# Patient Record
Sex: Female | Born: 1950 | Race: Black or African American | Hispanic: No | Smoking: Former smoker
Health system: Southern US, Community
[De-identification: ages and names within clinical notes are randomized; demographics above are authoritative.]

## PROBLEM LIST (undated history)

## (undated) DIAGNOSIS — R42 Dizziness and giddiness: Secondary | ICD-10-CM

## (undated) DIAGNOSIS — K769 Liver disease, unspecified: Secondary | ICD-10-CM

## (undated) DIAGNOSIS — K8020 Calculus of gallbladder without cholecystitis without obstruction: Secondary | ICD-10-CM

## (undated) DIAGNOSIS — E039 Hypothyroidism, unspecified: Secondary | ICD-10-CM

## (undated) DIAGNOSIS — J449 Chronic obstructive pulmonary disease, unspecified: Secondary | ICD-10-CM

## (undated) DIAGNOSIS — E7800 Pure hypercholesterolemia, unspecified: Secondary | ICD-10-CM

## (undated) DIAGNOSIS — I10 Essential (primary) hypertension: Secondary | ICD-10-CM

## (undated) DIAGNOSIS — E119 Type 2 diabetes mellitus without complications: Secondary | ICD-10-CM

## (undated) HISTORY — DX: Calculus of gallbladder without cholecystitis without obstruction: K80.20

## (undated) HISTORY — PX: CHOLECYSTECTOMY: SHX55

## (undated) HISTORY — DX: Liver disease, unspecified: K76.9

## (undated) HISTORY — PX: TOTAL VAGINAL HYSTERECTOMY: SHX2548

---

## 2019-03-11 ENCOUNTER — Other Ambulatory Visit (HOSPITAL_COMMUNITY): Payer: Self-pay | Admitting: Internal Medicine

## 2019-03-11 DIAGNOSIS — R0602 Shortness of breath: Secondary | ICD-10-CM

## 2019-03-17 ENCOUNTER — Ambulatory Visit (HOSPITAL_COMMUNITY): Payer: Medicare Other | Attending: Cardiology

## 2019-03-17 ENCOUNTER — Other Ambulatory Visit: Payer: Self-pay

## 2019-03-17 DIAGNOSIS — R0602 Shortness of breath: Secondary | ICD-10-CM | POA: Diagnosis not present

## 2019-08-22 ENCOUNTER — Other Ambulatory Visit: Payer: Self-pay

## 2019-08-22 ENCOUNTER — Ambulatory Visit (INDEPENDENT_AMBULATORY_CARE_PROVIDER_SITE_OTHER): Payer: Medicare Other | Admitting: Podiatry

## 2019-08-22 ENCOUNTER — Ambulatory Visit (INDEPENDENT_AMBULATORY_CARE_PROVIDER_SITE_OTHER): Payer: Medicare Other

## 2019-08-22 DIAGNOSIS — B351 Tinea unguium: Secondary | ICD-10-CM

## 2019-08-22 DIAGNOSIS — M79671 Pain in right foot: Secondary | ICD-10-CM

## 2019-08-22 DIAGNOSIS — M79674 Pain in right toe(s): Secondary | ICD-10-CM | POA: Diagnosis not present

## 2019-08-22 DIAGNOSIS — M79675 Pain in left toe(s): Secondary | ICD-10-CM

## 2019-08-22 DIAGNOSIS — M792 Neuralgia and neuritis, unspecified: Secondary | ICD-10-CM

## 2019-08-22 DIAGNOSIS — M79672 Pain in left foot: Secondary | ICD-10-CM

## 2019-08-22 MED ORDER — AMMONIUM LACTATE 12 % EX LOTN: 1.0000 "application " | TOPICAL_LOTION | CUTANEOUS | 0 refills | Status: DC | PRN

## 2019-08-22 MED ORDER — CICLOPIROX 8 % EX SOLN: Freq: Every day | CUTANEOUS | 0 refills | Status: DC

## 2019-08-22 MED ORDER — AMMONIUM LACTATE 12 % EX LOTN: 1.0000 "application " | TOPICAL_LOTION | CUTANEOUS | 0 refills | Status: AC | PRN

## 2019-08-26 ENCOUNTER — Encounter: Payer: Self-pay | Admitting: Podiatry

## 2019-08-26 NOTE — Progress Notes (Signed)
  Subjective:  Patient ID: Joy Becker, female    DOB: [birth year],  MRN: [id number]  Chief Complaint  Patient presents with  . Foot Pain    pt is here for bil foot pain, located on the dorsal foot, pain is elevated at night time. pain is a 8 out of 47   69 y.o. female returns for the above complaint.  Patient presents with pain across the dorsal and plantar aspect submetatarsal 1 through 5 bilaterally.  Patient states been going for a year.  She states is numbness tingling and burning sensation.  The pain is elevated at nighttime.  Patient is a type II diabetic on medication.  Her last A1c wasUnknown.  Patient will also has a secondary complaint of thickened elongated mycotic toenails x10.  She would like to know if they can be debrided down if she is not able to do it herself.  Objective:  There were no vitals filed for this visit. Podiatric Exam: Vascular: dorsalis pedis and posterior tibial pulses are palpable bilateral. Capillary return is immediate. Temperature gradient is WNL. Skin turgor WNL  Sensorium: Normal Semmes Weinstein monofilament test. Normal tactile sensation bilaterally. Nail Exam: Pt has thick disfigured discolored nails with subungual debris noted bilateral entire nail hallux through fifth toenails Ulcer Exam: There is no evidence of ulcer or pre-ulcerative changes or infection. Orthopedic Exam: Muscle tone and strength are WNL. No limitations in general ROM. No crepitus or effusions noted. HAV  B/L.  Hammer toes 2-5  B/L. Skin: No Porokeratosis. No infection or ulcers  Assessment & Plan:  Patient was evaluated and treated and all questions answered.  Onychomycosis with pain  -Nails palliatively debrided as below. -Educated on self-care -Penlac was dispensed as patient would like to try a topical medication to help address the fungus on her toes bilaterally.  I explained to her that this works about 30 to 40% of the time at best.  She states understanding would like to  still proceed with the topical medication. -AmLactin lotion was dispensed for dry skin bilaterally.  Procedure: Nail Debridement Rationale: pain  Type of Debridement: manual, sharp debridement. Instrumentation: Nail nipper, rotary burr. Number of Nails: 10  Procedures and Treatment: Consent by patient was obtained for treatment procedures. The patient understood the discussion of treatment and procedures well. All questions were answered thoroughly reviewed. Debridement of mycotic and hypertrophic toenails, 1 through 5 bilateral and clearing of subungual debris. No ulceration, no infection noted.  Return Visit-Office Procedure: Patient instructed to return to the office for a follow up visit 3 months for continued evaluation and treatment.  Boneta Lucks, DPM    No follow-ups on file.

## 2019-09-24 ENCOUNTER — Other Ambulatory Visit: Payer: Self-pay | Admitting: Internal Medicine

## 2019-09-24 DIAGNOSIS — Z1231 Encounter for screening mammogram for malignant neoplasm of breast: Secondary | ICD-10-CM

## 2019-09-29 ENCOUNTER — Encounter: Payer: Self-pay | Admitting: Internal Medicine

## 2019-11-07 ENCOUNTER — Other Ambulatory Visit: Payer: Self-pay | Admitting: Podiatry

## 2019-11-07 DIAGNOSIS — M79674 Pain in right toe(s): Secondary | ICD-10-CM

## 2019-11-07 DIAGNOSIS — B351 Tinea unguium: Secondary | ICD-10-CM

## 2019-11-07 DIAGNOSIS — M79675 Pain in left toe(s): Secondary | ICD-10-CM

## 2019-11-07 DIAGNOSIS — M792 Neuralgia and neuritis, unspecified: Secondary | ICD-10-CM

## 2019-11-21 ENCOUNTER — Other Ambulatory Visit: Payer: Self-pay

## 2019-11-21 ENCOUNTER — Ambulatory Visit (INDEPENDENT_AMBULATORY_CARE_PROVIDER_SITE_OTHER): Payer: Medicare Other | Admitting: Podiatry

## 2019-11-21 DIAGNOSIS — M79674 Pain in right toe(s): Secondary | ICD-10-CM

## 2019-11-21 DIAGNOSIS — M79675 Pain in left toe(s): Secondary | ICD-10-CM | POA: Diagnosis not present

## 2019-11-21 DIAGNOSIS — M778 Other enthesopathies, not elsewhere classified: Secondary | ICD-10-CM | POA: Diagnosis not present

## 2019-11-21 DIAGNOSIS — M19079 Primary osteoarthritis, unspecified ankle and foot: Secondary | ICD-10-CM | POA: Diagnosis not present

## 2019-11-21 DIAGNOSIS — B351 Tinea unguium: Secondary | ICD-10-CM

## 2019-11-27 NOTE — Progress Notes (Signed)
  Subjective:  Patient ID: Joy Becker, female    DOB: 21-Jul-1950,  MRN: [id number]  Chief Complaint  Patient presents with  . Foot Pain    pt is here for routine foot care, pt is also a diabetic type 2.   69 y.o. female returns for the above complaint.  Patient presents with a complaint of thickened elongated dystrophic toenails x10.  Patient states that they are painful to walk on.  They have gone along and she is unable to debride down herself.  She would like for me to debride all detail toenails.  She also has secondary complaint of left dorsal midfoot pain that started about an hour.  Is dull achy in nature.  Is severely associated with weather changes.  She has not seen anyone else for this pain.  She has not tried any other treatment option.  She denies any other acute complaints.    Objective:  There were no vitals filed for this visit. Podiatric Exam: Vascular: dorsalis pedis and posterior tibial pulses are palpable bilateral. Capillary return is immediate. Temperature gradient is WNL. Skin turgor WNL  Sensorium: Normal Semmes Weinstein monofilament test. Normal tactile sensation bilaterally. Nail Exam: Pt has thick disfigured discolored nails with subungual debris noted bilateral entire nail hallux through fifth toenails Ulcer Exam: There is no evidence of ulcer or pre-ulcerative changes or infection. Orthopedic Exam: Muscle tone and strength are WNL. No limitations in general ROM. No crepitus or effusions noted. HAV  B/L.  Hammer toes 2-5  B/L.  Pain on palpation to the dorsal midfoot across the second through third tarsometatarsal joint.  Crepitus noted.  Mild spurring palpable. Skin: No Porokeratosis. No infection or ulcers  Assessment & Plan:  Patient was evaluated and treated and all questions answered.  Dorsal midfoot arthritis/capsulitis -I explained to the patient the etiology of arthritis and various treatment options were extensively discussed.  I believe patient will  benefit from a steroid injection help decrease the acute inflammatory component associated with pain.  Patient agrees with the plan would like to proceed with injection. -A steroid injection was performed at left dorsal midfoot using 1% plain Lidocaine and 10 mg of Kenalog. This was well tolerated.   Onychomycosis with pain  -Nails palliatively debrided as below. -Educated on self-care -Penlac was dispensed as patient would like to try a topical medication to help address the fungus on her toes bilaterally.  I explained to her that this works about 30 to 40% of the time at best.  She states understanding would like to still proceed with the topical medication. -AmLactin lotion was dispensed for dry skin bilaterally.  Procedure: Nail Debridement Rationale: pain  Type of Debridement: manual, sharp debridement. Instrumentation: Nail nipper, rotary burr. Number of Nails: 10  Procedures and Treatment: Consent by patient was obtained for treatment procedures. The patient understood the discussion of treatment and procedures well. All questions were answered thoroughly reviewed. Debridement of mycotic and hypertrophic toenails, 1 through 5 bilateral and clearing of subungual debris. No ulceration, no infection noted.  Return Visit-Office Procedure: Patient instructed to return to the office for a follow up visit 3 months for continued evaluation and treatment.  Boneta Lucks, DPM    No follow-ups on file.

## 2020-01-20 ENCOUNTER — Ambulatory Visit (HOSPITAL_COMMUNITY)
Admission: RE | Admit: 2020-01-20 | Discharge: 2020-01-20 | Disposition: A | Discharge status: Home / Self Care | Payer: Medicare Other | Source: Ambulatory Visit | Attending: Vascular Surgery | Admitting: Vascular Surgery

## 2020-01-20 ENCOUNTER — Other Ambulatory Visit (HOSPITAL_COMMUNITY): Payer: Self-pay | Admitting: Internal Medicine

## 2020-01-20 ENCOUNTER — Other Ambulatory Visit: Payer: Self-pay

## 2020-01-20 DIAGNOSIS — R609 Edema, unspecified: Secondary | ICD-10-CM

## 2020-02-20 ENCOUNTER — Ambulatory Visit: Payer: Medicare Other | Admitting: Podiatry

## 2020-10-01 ENCOUNTER — Emergency Department (HOSPITAL_COMMUNITY)
Admission: EM | Admit: 2020-10-01 | Discharge: 2020-10-01 | Disposition: A | Discharge status: Home / Self Care | Payer: Medicare Other | Attending: Emergency Medicine | Admitting: Emergency Medicine

## 2020-10-01 ENCOUNTER — Other Ambulatory Visit: Payer: Self-pay

## 2020-10-01 ENCOUNTER — Emergency Department (HOSPITAL_COMMUNITY): Payer: Medicare Other

## 2020-10-01 ENCOUNTER — Encounter (HOSPITAL_COMMUNITY): Payer: Self-pay | Admitting: Emergency Medicine

## 2020-10-01 DIAGNOSIS — I959 Hypotension, unspecified: Secondary | ICD-10-CM | POA: Diagnosis not present

## 2020-10-01 DIAGNOSIS — K118 Other diseases of salivary glands: Secondary | ICD-10-CM | POA: Diagnosis not present

## 2020-10-01 DIAGNOSIS — R519 Headache, unspecified: Secondary | ICD-10-CM | POA: Insufficient documentation

## 2020-10-01 DIAGNOSIS — R630 Anorexia: Secondary | ICD-10-CM | POA: Diagnosis not present

## 2020-10-01 DIAGNOSIS — R42 Dizziness and giddiness: Secondary | ICD-10-CM

## 2020-10-01 LAB — CBC WITH DIFFERENTIAL/PLATELET
Abs Immature Granulocytes: 0.05 10*3/uL (ref 0.00–0.07)
Basophils Absolute: 0 10*3/uL (ref 0.0–0.1)
Basophils Relative: 1 %
Eosinophils Absolute: 0.4 10*3/uL (ref 0.0–0.5)
Eosinophils Relative: 7 %
HCT: 47.9 % — ABNORMAL HIGH (ref 36.0–46.0)
Hemoglobin: 15.9 g/dL — ABNORMAL HIGH (ref 12.0–15.0)
Immature Granulocytes: 1 %
Lymphocytes Relative: 12 %
Lymphs Abs: 0.7 10*3/uL (ref 0.7–4.0)
MCH: 26.5 pg (ref 26.0–34.0)
MCHC: 33.2 g/dL (ref 30.0–36.0)
MCV: 79.8 fL — ABNORMAL LOW (ref 80.0–100.0)
Monocytes Absolute: 0.8 10*3/uL (ref 0.1–1.0)
Monocytes Relative: 13 %
Neutro Abs: 4 10*3/uL (ref 1.7–7.7)
Neutrophils Relative %: 66 %
Platelets: 220 10*3/uL (ref 150–400)
RBC: 6 MIL/uL — ABNORMAL HIGH (ref 3.87–5.11)
RDW: 16 % — ABNORMAL HIGH (ref 11.5–15.5)
WBC: 6 10*3/uL (ref 4.0–10.5)
nRBC: 0 % (ref 0.0–0.2)

## 2020-10-01 LAB — COMPREHENSIVE METABOLIC PANEL
ALT: 24 U/L (ref 0–44)
AST: 22 U/L (ref 15–41)
Albumin: 3.1 g/dL — ABNORMAL LOW (ref 3.5–5.0)
Alkaline Phosphatase: 59 U/L (ref 38–126)
Anion gap: 7 (ref 5–15)
BUN: 9 mg/dL (ref 8–23)
CO2: 25 mmol/L (ref 22–32)
Calcium: 10.1 mg/dL (ref 8.9–10.3)
Chloride: 104 mmol/L (ref 98–111)
Creatinine, Ser: 1.1 mg/dL — ABNORMAL HIGH (ref 0.44–1.00)
GFR, Estimated: 55 mL/min — ABNORMAL LOW (ref >60)
Glucose, Bld: 119 mg/dL — ABNORMAL HIGH (ref 70–99)
Potassium: 4.1 mmol/L (ref 3.5–5.1)
Sodium: 136 mmol/L (ref 135–145)
Total Bilirubin: 0.5 mg/dL (ref 0.3–1.2)
Total Protein: 6.6 g/dL (ref 6.5–8.1)

## 2020-10-01 LAB — TROPONIN I (HIGH SENSITIVITY): Troponin I (High Sensitivity): 4 ng/L (ref <18)

## 2020-10-01 MED ORDER — MIDAZOLAM HCL 2 MG/2ML IJ SOLN
2.0000 mg | Freq: Once | INTRAMUSCULAR | Status: AC | PRN
  Administered 2020-10-01: 2 mg via INTRAVENOUS
  Filled 2020-10-01: qty 2

## 2020-10-01 MED ORDER — SODIUM CHLORIDE 0.9 % IV BOLUS
1000.0000 mL | Freq: Once | INTRAVENOUS | Status: AC
  Administered 2020-10-01: 1000 mL via INTRAVENOUS

## 2020-10-01 MED ORDER — MECLIZINE HCL 25 MG PO TABS
25.0000 mg | ORAL_TABLET | Freq: Once | ORAL | Status: AC
  Administered 2020-10-01: 25 mg via ORAL
  Filled 2020-10-01: qty 1

## 2020-10-01 NOTE — ED Notes (Signed)
Patient up and ambulatory to restroom from room 26. Steady and stable. Denies dizziness at this time.

## 2020-10-01 NOTE — ED Triage Notes (Signed)
Emergency Medicine Provider Triage Evaluation Note  Joy Becker , a 70 y.o. female  was evaluated in triage.  Pt complains of lightheadedness, dizziness and balance issues.  First noticed March 29th 2022.  Last night fell while going to the bathroom last.  Denies any head trauma or LOC in fall.  Currently taking meclizine with little improvement in symptoms.  Dizziness worse when standing.   Currently on bactrim for UTI.    Review of Systems  Positive: Dizziness,  Negative: Head pain, neck pain, back pain  Physical Exam  BP (!) 95/48 (BP Location: Right Arm)   Pulse 88   Temp 99.3 F (37.4 C) (Oral)   Resp 14   SpO2 94%  Gen:   Awake, no distress   HEENT:  Atraumatic  Resp:  Normal effort  Cardiac:  Normal rate  MSK:   Moves extremities without difficulty, no midline tenderness or deformity to cervical, thoracic or lumbar spine  Neuro:  Speech clear, CN 2-12 intact, equal grip strength   Medical Decision Making  Medically screening exam initiated at 4:21 PM.  Appropriate orders placed.  Joy Becker was informed that the remainder of the evaluation will be completed by another provider, this initial triage assessment does not replace that evaluation, and the importance of remaining in the ED until their evaluation is complete.  Clinical Impression   Dizziness; The patient appears stable so that the remainder of the work up may be completed by another provider.     Loni Beckwith, [place] 10/01/20 1630

## 2020-10-01 NOTE — ED Triage Notes (Signed)
Pt coming from home. Complaint of lightheadedness since last month, states shes been seen by her dr but not getting any better.

## 2020-10-01 NOTE — ED Notes (Signed)
Patient returns from MRI- placed back on monitor at this time. Resting comfortably, awaiting imaging results.

## 2020-10-01 NOTE — ED Notes (Signed)
Patient to MRI, placed on 2L due to versed.

## 2020-10-01 NOTE — Discharge Instructions (Addendum)
Your MRI scan showed signs of your old stroke in the back of the brain.  An old stroke can have symptoms flare up again.  This may be causing your blurred vision and headache.  You need to follow up with a neurologist for this.  I placed a referral to neurology.    You have a nodule or spot in your parotid gland (near your cheek).  You will need to make an appointment with an ENT doctor about this issue.  Call the number above for [facility].  This can be done as an outpatient.  Your blood pressure was too low today.  I advised that you cut your Losartan medication dose in half.  You will take 50 mg Losartan every day, and 50 mg Metoprolol.  Check your blood pressure every day and keep a diary.  Your primary care doctor can make further adjustments.  Low blood pressure can cause dizziness, lightheadedness.  If you ever feel dizzy and your blood pressure is less than 90 mmhg (top number), don't take your evening blood pressure medicine, and lay down flat.  If you still feel dizzy after 10 minutes, call 911 and come back to the ER.  You should come back to the ER if you begin feeling very lightheaded, began having chest pain, difficulty breathing, or feel like passing out.  You should also return if you have difficulty speaking, numbness or weakness of your arms or legs, severe headache, or any other symptoms concerning for stroke.

## 2020-10-01 NOTE — ED Notes (Signed)
Patient reports dizziness since 3/29. Was seen at PCP and given medication for that with no relief. Patient then reports that she was dx with UTI and put on Abx as well. Patient reports last night feeling "like I overdosed or something, I ran into a wall and was really dizzy." Patient reporting headache for last 2 days. Reports neck stiffness and 4/10 pain. Patient appears in no acute distress at this time.

## 2020-10-01 NOTE — ED Provider Notes (Signed)
[place] EMERGENCY DEPARTMENT Provider Note   CSN: [id number] Arrival date & time: 10/01/20  1514     History Chief Complaint  Patient presents with  . Dizziness    Joy Becker is a 70 y.o. female with history of type 2 diabetes, hypertension, presenting to the emergency department with headache, vertigo, blurred vision.  Patient reports onset of symptoms approximately 9 to 10 days ago.  She says she woke up feeling generally lightheaded, but specifically describes vertigo.  She says it is worse when standing up from sitting.  She feels like she is walking drunk, needing to grab onto things when she is walking.  She also describes a headache beginning in the past 1 to 2 days, which is frontal and posterior located.  She does suffer from chronic headaches but feels this is a new pattern.  She also describes to me some blurred vision in both of her eyes for the past week.  She wears prescription lenses.  She reports nausea and poor appetite for the past several days.  She denies vomiting, diarrhea, constipation.  She denies fevers, chills, cough, congestion.  She states she thinks she may have had a stroke about 4 to 5 years ago, worked up in [employer]. Her symptoms were blurred vision at the time.  She denies to me any chest pain or new shortness of breath.  She arrives with a medication list, which includes metoprolol 50 mg daily, as well as losartan 100 mg/day.  She says normally her blood pressure is high.  She states that she does think she drinks a lot of water at home.  Her daughter at the bedside disputes this and thinks maybe she does not drink enough water.  She is also being treated currently with Bactrim for UTI, though she is asymptomatic in terms of dysuria.  She's had 3 doses of the covid vaccine.  She does not smoke.  HPI     History reviewed. No pertinent past medical history.  There are no problems to display for this  patient.   History reviewed. No pertinent surgical history.   OB History   No obstetric history on file.     History reviewed. No pertinent family history.     Home Medications Prior to Admission medications   Medication Sig Start Date End Date Taking? Authorizing Provider  albuterol (VENTOLIN HFA) 108 (90 Base) MCG/ACT inhaler INHALE 1 2 PUFFS INTO THE LUNGS EVERY 6 HOURS FOR WHEEZING 10/21/19   [provider]  ammonium lactate (AMLACTIN) 12 % lotion Apply 1 application topically as needed for dry skin. 08/22/19   Felipa Furnace, DPM  ciclopirox (PENLAC) 8 % solution Apply topically at bedtime. Apply over nail and surrounding skin. Apply daily over previous coat. After seven (7) days, may remove with alcohol and continue cycle. 08/22/19   Felipa Furnace, DPM  famotidine (PEPCID) 20 MG tablet Take 20 mg by mouth 2 (two) times daily. 10/16/19   [provider]  INCRUSE ELLIPTA 62.5 MCG/INH AEPB 1 puff daily. 08/06/19   [provider]  JANUVIA 100 MG tablet Take 100 mg by mouth daily. 08/09/19   [provider]    Allergies    Ace inhibitors and Azithromycin  Review of Systems   Review of Systems  Constitutional: Positive for appetite change. Negative for chills and fever.  Eyes: Positive for visual disturbance. Negative for photophobia, pain, discharge and redness.  Respiratory: Negative for cough and shortness of  breath.   Cardiovascular: Negative for chest pain and palpitations.  Gastrointestinal: Negative for abdominal pain, diarrhea, nausea and vomiting.  Genitourinary: Negative for dysuria and hematuria.  Musculoskeletal: Negative for arthralgias and myalgias.  Skin: Negative for color change and rash.  Neurological: Positive for dizziness, light-headedness and headaches. Negative for seizures, syncope, facial asymmetry, speech difficulty, weakness and numbness.  Psychiatric/Behavioral: Negative for agitation and confusion.  All other  systems reviewed and are negative.   Physical Exam Updated Vital Signs BP (!) 148/71   Pulse 74   Temp 99.3 F (37.4 C) (Oral)   Resp (!) 26   SpO2 94%   Physical Exam Constitutional:      General: She is not in acute distress. HENT:     Head: Normocephalic and atraumatic.  Eyes:     Conjunctiva/sclera: Conjunctivae normal.     Pupils: Pupils are equal, round, and reactive to light.  Cardiovascular:     Rate and Rhythm: Normal rate and regular rhythm.     Pulses: Normal pulses.  Pulmonary:     Effort: Pulmonary effort is normal. No respiratory distress.  Abdominal:     General: There is no distension.     Tenderness: There is no abdominal tenderness.  Skin:    General: Skin is warm and dry.  Neurological:     General: No focal deficit present.     Mental Status: She is alert. Mental status is at baseline.     GCS: GCS eye subscore is 4. GCS verbal subscore is 5. GCS motor subscore is 6.     Motor: Motor function is intact.     Coordination: Coordination is intact. Finger-Nose-Finger Test normal.     Comments: Unilateral right-beating nystagmus with leftward gaze    Psychiatric:        Mood and Affect: Mood normal.        Behavior: Behavior normal.     ED Results / Procedures / Treatments   Labs (all labs ordered are listed, but only abnormal results are displayed) Labs Reviewed  CBC WITH DIFFERENTIAL/PLATELET - Abnormal; Notable for the following components:      Result Value   RBC 6.00 (*)    Hemoglobin 15.9 (*)    HCT 47.9 (*)    MCV 79.8 (*)    RDW 16.0 (*)    All other components within normal limits  COMPREHENSIVE METABOLIC PANEL - Abnormal; Notable for the following components:   Glucose, Bld 119 (*)    Creatinine, Ser 1.10 (*)    Albumin 3.1 (*)    GFR, Estimated 55 (*)    All other components within normal limits  TROPONIN I (HIGH SENSITIVITY)    EKG EKG Interpretation  Date/Time:  Friday October 01 2020 21:09:53 EDT Ventricular Rate:   76 PR Interval:  174 QRS Duration: 97 QT Interval:  380 QTC Calculation: 428 R Axis:   53 Text Interpretation: Sinus rhythm Low voltage, precordial leads Confirmed by Octaviano Glow [phone]) on 10/01/2020 9:15:03 PM   Radiology MR BRAIN WO CONTRAST  Result Date: 10/01/2020 CLINICAL DATA:  Initial evaluation for neuro deficit, stroke suspected, vertigo, blurry vision. EXAM: MRI HEAD WITHOUT CONTRAST TECHNIQUE: Multiplanar, multiecho pulse sequences of the brain and surrounding structures were obtained without intravenous contrast. COMPARISON:  None available. FINDINGS: Brain: Cerebral volume within normal limits for age. Minimal hazy T2/FLAIR hyperintensity seen within the periventricular white matter, most likely related chronic microvascular ischemic disease, felt to be within normal limits for age. Focal area of  encephalomalacia and gliosis seen involving the parasagittal left occipital lobe, consistent with a chronic left PCA distribution infarct. No abnormal foci of restricted diffusion to suggest acute or subacute ischemia. Gray-white matter differentiation otherwise maintained. No other areas of remote cortical infarction. No foci of susceptibility artifact to suggest acute or chronic intracranial hemorrhage. No mass lesion, shift or mass effect. No hydrocephalus or extra-axial fluid collection. Pituitary gland suprasellar region within normal limits. Midline structures intact. Vascular: Hypoplastic left vertebral artery not well seen. Major intracranial vascular flow voids are otherwise maintained. Skull and upper cervical spine: Craniocervical junction within normal limits. Bone marrow signal intensity heterogeneous. No focal marrow replacing lesion. No scalp soft tissue abnormality. Sinuses/Orbits: Patient status post bilateral ocular lens replacement. Globes and orbital soft tissues demonstrate no acute finding. Moderate mucosal thickening noted within the right maxillary sinus with superimposed  air-fluid level. Small left maxillary sinus retention cyst. No mastoid effusion. Inner ear structures grossly normal. Other: 1.4 cm T2 hypointense lesion noted within the inferior left parotid gland (series 17, image 18), indeterminate. IMPRESSION: 1. No acute intracranial abnormality. 2. Chronic left PCA distribution infarct. 3. Acute right maxillary sinusitis. 4. 1.4 cm lesion within the inferior left parotid gland, indeterminate. Outpatient ENT referral for further workup and consultation suggested. Electronically Signed   By: Jeannine Boga M.D.   On: 10/01/2020 19:30    Procedures Procedures   Medications Ordered in ED Medications  midazolam (VERSED) injection 2 mg (2 mg Intravenous Given 10/01/20 1837)  sodium chloride 0.9 % bolus 1,000 mL (0 mLs Intravenous Stopped 10/01/20 2259)  meclizine (ANTIVERT) tablet 25 mg (25 mg Oral Given 10/01/20 2133)    ED Course  I have reviewed the triage vital signs and the nursing notes.  Pertinent labs & imaging results that were available during my care of the patient were reviewed by me and considered in my medical decision making (see chart for details).  This patient complains of vertigo, blurred vision. This involves an extensive number of treatment options, and is a complaint that carries with it a high risk of complications and morbidity.  The differential diagnosis includes posterior CVA vs anemia vs hypotension vs dehydration vs viral illness vs other  I do not have access to her prior stroke workup in [place] 4-5 years ago, but she reports very similar symptoms.  Therefore I opted to proceed with MRI brain.  Her symptoms had been ongoing for > 24 hours. This showed an old infarct in posterior/occipital territory.  It is very possible she is experiencing recrudescence of her prior symptoms in the setting of hypotension.  Her unilateral nystagmus is suggestive moreso of peripheral vertigo, although she has minimal active symptoms to  complete a full HINTS exam.  Hypotension cause is unclear.  Hgb is 15.9 suggesting some hemoconcentration possibly.  May be dehydrated.  IV fluids given 1L for hydration.    Trop 4.  ECG reassuring - doubt ACS.   Clinical Course as of 10/02/20 0032  Fri Oct 01, 2020  2127 I spoke to Dr Curly Shores from neurology who agreed with me that the symptoms may in fact be recrudescence of her prior stroke, particularly with the distribution seen on the MRI.  This may be exacerbated by the patient's hypotension.  We will continue to watch her blood pressure, which is gradually improving, particularly after fluids.  If the patient is able to ambulate and otherwise has minimal symptoms, she can follow-up as an outpatient neurology.  We would need to  make some adjustments to her blood pressure medications to ensure that she does not remain hypotensive at home. [MT]  2258 Pt ambulated, headache improving with BP here.  I discussed her MRI findings.  No acute infarct.  Will place [place] referral to expedite f/u.  Discussed halfing her losartan to 50 mg daily, and checking BP daily.  Also discussed parotid gland nodule - can f/u as outpatient. [MT]    Clinical Course User Index [MT] Nahiem Dredge, Carola Rhine, MD    Final Clinical Impression(s) / ED Diagnoses Final diagnoses:  Vertigo  Nodule of parotid gland  Hypotension, unspecified hypotension type    Rx / DC Orders ED Discharge Orders         Ordered    Ambulatory referral to Neurology       Comments: An appointment is requested in approximately: 1 week Seen in ED for suspected recrudescence of prior stroke on MRI imaging - advised close neuro follow up   10/01/20 2305           Wyvonnia Dusky, MD 10/02/20 [id number]

## 2020-10-01 NOTE — ED Notes (Signed)
Troponin added on to lab draw. Lab notified and running at this time.

## 2020-10-14 ENCOUNTER — Encounter: Payer: Self-pay | Admitting: Neurology

## 2020-10-26 ENCOUNTER — Other Ambulatory Visit (HOSPITAL_COMMUNITY): Payer: Self-pay | Admitting: Physician Assistant

## 2020-10-26 DIAGNOSIS — K119 Disease of salivary gland, unspecified: Secondary | ICD-10-CM

## 2020-10-27 ENCOUNTER — Encounter (HOSPITAL_COMMUNITY): Payer: Self-pay

## 2020-10-27 NOTE — Progress Notes (Unsigned)
   Joy Becker Female, 70 y.o., [birth year]  MRN:  [id number] Phone:  [phone] Jerilynn Mages)       PCP:  Shon Baton, MD Primary Cvg:  [place] [employer]  Next Appt With Radiology ([facility]-US 2) 11/09/2020 at 1:00 PM           RE: FNA Received: Yesterday  Message Details  Criselda Peaches, MD  [facility] E Approved for FNA of small left parotid nodule.   HKM    Previous Messages  ----- Message -----  From: Lenore Cordia  Sent: 10/26/2020  3:51 PM EDT  To: Ir Procedure Requests  Subject: FNA                        Procedure Requested: [place] FNA Parotid    Reason for Procedure: lesion of parotid gland    Provider Requesting: Shon Hale  Provider Telephone: [phone]    Other Info:

## 2020-11-08 ENCOUNTER — Other Ambulatory Visit: Payer: Self-pay | Admitting: Student

## 2020-11-09 ENCOUNTER — Ambulatory Visit (HOSPITAL_COMMUNITY)
Admission: RE | Admit: 2020-11-09 | Discharge: 2020-11-09 | Disposition: A | Discharge status: Home / Self Care | Payer: Medicare Other | Source: Ambulatory Visit | Attending: Physician Assistant | Admitting: Physician Assistant

## 2020-11-09 ENCOUNTER — Other Ambulatory Visit: Payer: Self-pay

## 2020-11-09 ENCOUNTER — Other Ambulatory Visit (HOSPITAL_COMMUNITY): Payer: Self-pay | Admitting: Physician Assistant

## 2020-11-09 ENCOUNTER — Encounter (HOSPITAL_COMMUNITY): Payer: Self-pay

## 2020-11-09 DIAGNOSIS — E7800 Pure hypercholesterolemia, unspecified: Secondary | ICD-10-CM | POA: Insufficient documentation

## 2020-11-09 DIAGNOSIS — K119 Disease of salivary gland, unspecified: Secondary | ICD-10-CM

## 2020-11-09 DIAGNOSIS — R42 Dizziness and giddiness: Secondary | ICD-10-CM

## 2020-11-09 HISTORY — DX: Hypothyroidism, unspecified: E03.9

## 2020-11-09 HISTORY — DX: Essential (primary) hypertension: I10

## 2020-11-09 HISTORY — DX: Dizziness and giddiness: R42

## 2020-11-09 HISTORY — DX: Chronic obstructive pulmonary disease, unspecified: J44.9

## 2020-11-09 HISTORY — DX: Type 2 diabetes mellitus without complications: E11.9

## 2020-11-09 HISTORY — DX: Pure hypercholesterolemia, unspecified: E78.00

## 2020-11-09 MED ORDER — MIDAZOLAM HCL 2 MG/2ML IJ SOLN
INTRAMUSCULAR | Status: AC
  Filled 2020-11-09: qty 2

## 2020-11-09 MED ORDER — LIDOCAINE-EPINEPHRINE 1 %-1:100000 IJ SOLN
INTRAMUSCULAR | Status: AC
  Filled 2020-11-09: qty 1

## 2020-11-09 MED ORDER — FENTANYL CITRATE (PF) 100 MCG/2ML IJ SOLN
INTRAMUSCULAR | Status: AC
  Filled 2020-11-09: qty 2

## 2020-11-09 MED ORDER — SODIUM CHLORIDE 0.9 % IV SOLN: INTRAVENOUS | Status: DC

## 2020-11-09 NOTE — H&P (Signed)
Chief Complaint: Patient was seen in consultation today for left parotid gland lesion biopsy at the request of Spainhour,David S  Referring Physician(s): Spainhour,David S  Supervising Physician: Sandi Mariscal  Patient Status: [facility] - Out-pt  History of Present Illness: Joy Becker is a 70 y.o. female   DM; HTN; COPD; high cholesterol Pt was seen in ED 10/01/20 for vertigo N/V; vision change Dx vertigo-- taking meds for same-- seems to help  MRI done that day: IMPRESSION: 1. No acute intracranial abnormality. 2. Chronic left PCA distribution infarct. 3. Acute right maxillary sinusitis. 4. 1.4 cm lesion within the inferior left parotid gland, indeterminate. Outpatient ENT referral for further workup and consultation suggested.  Was sent to Dr West Carbo-- now scheduled for left parotid gland lesion biopsy    Past Medical History:  Diagnosis Date  . COPD (chronic obstructive pulmonary disease) ([place])   . Diabetes mellitus without complication ([place])   . High cholesterol   . Hypertension   . Hypothyroidism   . Vertigo     History reviewed. No pertinent surgical history.  Allergies: Ace inhibitors and Azithromycin  Medications: Prior to Admission medications   Medication Sig Start Date End Date Taking? Authorizing Provider  acetaminophen (TYLENOL) 500 MG tablet Take 500-1,000 mg by mouth every 8 (eight) hours as needed for moderate pain or headache.   Yes [provider]  albuterol (VENTOLIN HFA) 108 (90 Base) MCG/ACT inhaler Inhale 1-2 puffs into the lungs every 6 (six) hours as needed for wheezing or shortness of breath. 10/21/19  Yes [provider]  alendronate (FOSAMAX) 70 MG tablet Take 70 mg by mouth every Sunday. Take with a full glass of water on an empty stomach.   Yes [provider]  ammonium lactate (AMLACTIN) 12 % lotion Apply 1 application topically as needed for dry skin. Patient taking differently: Apply 1 application  topically daily as needed for dry skin. 08/22/19  Yes Felipa Furnace, DPM  aspirin EC 81 MG tablet Take 81 mg by mouth daily. Swallow whole.   Yes [provider]  empagliflozin (JARDIANCE) 10 MG TABS tablet Take 10 mg by mouth daily.   Yes [provider]  famotidine (PEPCID) 20 MG tablet Take 20 mg by mouth 2 (two) times daily. 10/16/19  Yes [provider]  furosemide (LASIX) 20 MG tablet Take 20 mg by mouth daily.   Yes [provider]  INCRUSE ELLIPTA 62.5 MCG/INH AEPB Inhale 1 puff into the lungs daily. 08/06/19  Yes [provider]  JANUVIA 100 MG tablet Take 100 mg by mouth daily. 08/09/19  Yes [provider]  levothyroxine (SYNTHROID) 125 MCG tablet Take 125 mcg by mouth daily before breakfast.   Yes [provider]  linaclotide (LINZESS) 72 MCG capsule Take 72 mcg by mouth daily before breakfast.   Yes [provider]  losartan (COZAAR) 100 MG tablet Take 50 mg by mouth daily.   Yes [provider]  metoprolol succinate (TOPROL-XL) 50 MG 24 hr tablet Take 50 mg by mouth daily. Take with or immediately following a meal.   Yes [provider]  montelukast (SINGULAIR) 10 MG tablet Take 10 mg by mouth at bedtime.   Yes [provider]  Polyethyl Glycol-Propyl Glycol (SYSTANE) 0.4-0.3 % SOLN Place 1 drop into both eyes daily as needed (dry eyes).   Yes [provider]  rosuvastatin (CRESTOR) 10 MG tablet Take 10 mg by mouth daily.   Yes [provider]  Vitamin D,  Ergocalciferol, (DRISDOL) 1.25 MG (50000 UNIT) CAPS capsule Take 50,000 Units by mouth every Saturday.   Yes [provider]  ciclopirox (PENLAC) 8 % solution Apply topically at bedtime. Apply over nail and surrounding skin. Apply daily over previous coat. After seven (7) days, may remove with alcohol and continue cycle. Patient not taking: Reported on 10/28/2020 08/22/19   Felipa Furnace, DPM     History reviewed.  No pertinent family history.  Social History   Socioeconomic History  . Marital status: Unknown    Spouse name: Not on file  . Number of children: Not on file  . Years of education: Not on file  . Highest education level: Not on file  Occupational History  . Not on file  Tobacco Use  . Smoking status: Not on file  . Smokeless tobacco: Not on file  Substance and Sexual Activity  . Alcohol use: Not on file  . Drug use: Not on file  . Sexual activity: Not on file  Other Topics Concern  . Not on file  Social History Narrative  . Not on file   Social Determinants of Health   Financial Resource Strain: Not on file  Food Insecurity: Not on file  Transportation Needs: Not on file  Physical Activity: Not on file  Stress: Not on file  Social Connections: Not on file    Review of Systems: A 12 point ROS discussed and pertinent positives are indicated in the HPI above.  All other systems are negative.  Review of Systems  Constitutional: Negative for activity change, fatigue and fever.  HENT: Negative for sore throat, tinnitus and trouble swallowing.   Respiratory: Negative for cough and shortness of breath.   Cardiovascular: Negative for chest pain.  Gastrointestinal: Negative for abdominal pain.  Musculoskeletal: Negative for back pain.  Neurological: Negative for dizziness, seizures, speech difficulty, weakness, light-headedness, numbness and headaches.  Psychiatric/Behavioral: Negative for behavioral problems and confusion.    Vital Signs: BP 139/85   Pulse 60   Temp 98.4 F (36.9 C) (Oral)   Ht 5\' 5"  (1.651 m)   Wt 251 lb 8 oz (114.1 kg)   SpO2 96%   BMI 41.85 kg/m   Physical Exam Vitals reviewed.  HENT:     Mouth/Throat:     Mouth: Mucous membranes are moist.  Cardiovascular:     Rate and Rhythm: Normal rate and regular rhythm.     Heart sounds: Normal heart sounds.  Pulmonary:     Effort: Pulmonary effort is normal.     Breath sounds: Normal breath  sounds.  Abdominal:     Palpations: Abdomen is soft.  Musculoskeletal:        General: Normal range of motion.  Skin:    General: Skin is warm.  Neurological:     Mental Status: She is alert and oriented to person, place, and time.  Psychiatric:        Behavior: Behavior normal.     Imaging: No results found.  Labs:  CBC: Recent Labs    10/01/20 1633  WBC 6.0  HGB 15.9*  HCT 47.9*  PLT 220    COAGS: No results for input(s): INR, APTT in the last 8760 hours.  BMP: Recent Labs    10/01/20 1633  NA 136  K 4.1  CL 104  CO2 25  GLUCOSE 119*  BUN 9  CALCIUM 10.1  CREATININE 1.10*  GFRNONAA 55*    LIVER FUNCTION TESTS: Recent Labs    10/01/20 1633  BILITOT 0.5  AST 22  ALT 24  ALKPHOS 59  PROT 6.6  ALBUMIN 3.1*    TUMOR MARKERS: No results for input(s): AFPTM, CEA, CA199, CHROMGRNA in the last 8760 hours.  Assessment and Plan:  Left parotid gland lesion For biopsy today Risks and benefits of left parotid gland bx was discussed with the patient and/or patient's family including, but not limited to bleeding, infection, damage to adjacent structures or low yield requiring additional tests.  All of the questions were answered and there is agreement to proceed.  Consent signed and in chart.   Thank you for this interesting consult.  I greatly enjoyed meeting Joy Becker and look forward to participating in their care.  A copy of this report was sent to the requesting provider on this date.  Electronically Signed: Lavonia Drafts, PA-C 11/09/2020, 11:44 AM   I spent a total of  30 Minutes   in face to face in clinical consultation, greater than 50% of which was counseling/coordinating care for left parotid gland lesion bx

## 2020-11-09 NOTE — Progress Notes (Signed)
PIV removed and patient discharged with daughter to lobby via wheelchair.

## 2020-11-09 NOTE — Progress Notes (Signed)
No biopsy completed while in ultrasound suite.Per Dr. Watts, patient is able to discharge home with daughter. Will return to Radiology Nurse's [facility] prior to discharge home.  

## 2020-11-09 NOTE — Sedation Documentation (Signed)
No biopsy completed while in ultrasound suite.Per Dr. Pascal Lux, patient is able to discharge home with daughter. Will return to Radiology Nurse's Station prior to discharge home.

## 2020-12-19 NOTE — Progress Notes (Deleted)
NEUROLOGY CONSULTATION NOTE  Joy Becker MRN: [id number] DOB: [birth year]  Referring provider: Octaviano Glow, MD (ED referral) Primary care provider: Shon Baton, MD  Reason for consult:  stroke  Assessment/Plan:   ***   Subjective:  Joy Becker is a 70 year old ***-handed female with HTN, type 2 diabetes mellitus, COPD and hypothyroidism who presents for stroke.  History supplemented by ED note.  In late March 2022, she woke up one morning with ***.  Worse when standing up from sitting position.  While walking, she felt drunk and needed to grab on to things for support.  In early April, she started experiencing bi-frontal and occipital headache with blurred vision in both eyes.  Several years prior, she reported that she had a possible stroke presenting as blurred vision, so she went to the ED on 10/01/2020.  Exam revealed right beating nystagmus with leftward gaze but otherwise nonfocal.  Blood pressure was reportedly low ***.  MRI of brain personally reviewed showed chronic left PCA territory infarct involving the occipital lobe but no acute abnormality.  Recrudescence of symptoms due to hypotension was suspected.  She was hydrated and her losartan was decreased.  ***.       PAST MEDICAL HISTORY: Past Medical History:  Diagnosis Date   COPD (chronic obstructive pulmonary disease) ([place])    Diabetes mellitus without complication ([facility])    High cholesterol    Hypertension    Hypothyroidism    Vertigo     PAST SURGICAL HISTORY: No past surgical history on file.  MEDICATIONS: Current Outpatient Medications on File Prior to Visit  Medication Sig Dispense Refill   acetaminophen (TYLENOL) 500 MG tablet Take 500-1,000 mg by mouth every 8 (eight) hours as needed for moderate pain or headache.     albuterol (VENTOLIN HFA) 108 (90 Base) MCG/ACT inhaler Inhale 1-2 puffs into the lungs every 6 (six) hours as needed for wheezing or shortness of breath.     alendronate (FOSAMAX) 70 MG  tablet Take 70 mg by mouth every Sunday. Take with a full glass of water on an empty stomach.     ammonium lactate (AMLACTIN) 12 % lotion Apply 1 application topically as needed for dry skin. (Patient taking differently: Apply 1 application topically daily as needed for dry skin.) 400 g 0   aspirin EC 81 MG tablet Take 81 mg by mouth daily. Swallow whole.     ciclopirox (PENLAC) 8 % solution Apply topically at bedtime. Apply over nail and surrounding skin. Apply daily over previous coat. After seven (7) days, may remove with alcohol and continue cycle. (Patient not taking: Reported on 10/28/2020) 6.6 mL 0   empagliflozin (JARDIANCE) 10 MG TABS tablet Take 10 mg by mouth daily.     famotidine (PEPCID) 20 MG tablet Take 20 mg by mouth 2 (two) times daily.     furosemide (LASIX) 20 MG tablet Take 20 mg by mouth daily.     INCRUSE ELLIPTA 62.5 MCG/INH AEPB Inhale 1 puff into the lungs daily.     JANUVIA 100 MG tablet Take 100 mg by mouth daily.     levothyroxine (SYNTHROID) 125 MCG tablet Take 125 mcg by mouth daily before breakfast.     linaclotide (LINZESS) 72 MCG capsule Take 72 mcg by mouth daily before breakfast.     losartan (COZAAR) 100 MG tablet Take 50 mg by mouth daily.     metoprolol succinate (TOPROL-XL) 50 MG 24 hr tablet Take 50 mg by mouth daily. Take with  or immediately following a meal.     montelukast (SINGULAIR) 10 MG tablet Take 10 mg by mouth at bedtime.     Polyethyl Glycol-Propyl Glycol (SYSTANE) 0.4-0.3 % SOLN Place 1 drop into both eyes daily as needed (dry eyes).     rosuvastatin (CRESTOR) 10 MG tablet Take 10 mg by mouth daily.     Vitamin D, Ergocalciferol, (DRISDOL) 1.25 MG (50000 UNIT) CAPS capsule Take 50,000 Units by mouth every Saturday.     No current facility-administered medications on file prior to visit.    ALLERGIES: Allergies  Allergen Reactions   Ace Inhibitors Other (See Comments)    cough   Azithromycin Other (See Comments)    Unaware     FAMILY  HISTORY: No family history on file.  Objective:  *** General: No acute distress.  Patient appears well-groomed.   Head:  Normocephalic/atraumatic Eyes:  fundi examined but not visualized Neck: supple, no paraspinal tenderness, full range of motion Back: No paraspinal tenderness Heart: regular rate and rhythm Lungs: Clear to auscultation bilaterally. Vascular: No carotid bruits. Neurological Exam: Mental status: alert and oriented to person, place, and time, recent and remote memory intact, fund of knowledge intact, attention and concentration intact, speech fluent and not dysarthric, language intact. Cranial nerves: CN I: not tested CN II: pupils equal, round and reactive to light, visual fields intact CN III, IV, VI:  full range of motion, no nystagmus, no ptosis CN V: facial sensation intact. CN VII: upper and lower face symmetric CN VIII: hearing intact CN IX, X: gag intact, uvula midline CN XI: sternocleidomastoid and trapezius muscles intact CN XII: tongue midline Bulk & Tone: normal, no fasciculations. Motor:  muscle strength 5/5 throughout Sensation:  Pinprick, temperature and vibratory sensation intact. Deep Tendon Reflexes:  2+ throughout,  toes downgoing.   Finger to nose testing:  Without dysmetria.   Heel to shin:  Without dysmetria.   Gait:  Normal station and stride.  Romberg negative.    Thank you for allowing me to take part in the care of this patient.  Metta Clines, DO  CC: Shon Baton, MD

## 2020-12-20 ENCOUNTER — Ambulatory Visit: Payer: Medicare Other | Admitting: Neurology

## 2020-12-21 NOTE — Progress Notes (Signed)
NEUROLOGY CONSULTATION NOTE  Joy Becker MRN: [id number] DOB: 10/24/1950  Referring provider: Octaviano Glow, MD (ED referral) Primary care provider: Shon Baton, MD  Reason for consult:  stroke  Assessment/Plan:   Dizziness/headache/weakness - nonfocal symptoms.  I do not believe this represented a cerebrovascular event, either TIA or recrudescence of prior stroke symptoms.  Likely secondary to UTI and hypotension.   History of stroke Hypertension Type 2 diabetes mellitus Hyperlipidemia  Continue secondary stroke prevention management (ASA, statin, glycemic control, normotensive blood pressure) as per PCP.  Follow up as needed.   Subjective:  Joy Becker is a 70 year old left-handed female with HTN, type 2 diabetes mellitus, COPD and hypothyroidism who presents for stroke.  History supplemented by ED note.  In late March 2022, she woke up one morning with dizziness..  Worse when standing up from sitting position.  While walking, she felt drunk and needed to grab on to things for support.  In early April, she started experiencing bi-frontal and occipital headache with blurred vision in both eyes.  Her legs hurt and felt weak.  She was found to have a UTI and was started on anti-biotics.  Several years prior, she reported that she had a possible stroke presenting as headache, blurred vision, so she went to the ED on 10/01/2020.  Exam revealed right beating nystagmus with leftward gaze but otherwise nonfocal.  Blood pressure was reportedly low, although I cannot find a specific reading.  The only reading available on her note was normal.  MRI of brain personally reviewed showed chronic left PCA territory infarct involving the occipital lobe but no acute abnormality.  Recrudescence of symptoms due to hypotension was suspected.  She was hydrated and her losartan was decreased.  Symptoms resolved over the next week.       PAST MEDICAL HISTORY: Past Medical History:  Diagnosis Date    COPD (chronic obstructive pulmonary disease) ([place])    Diabetes mellitus without complication ([facility])    High cholesterol    Hypertension    Hypothyroidism    Vertigo     PAST SURGICAL HISTORY: No past surgical history on file.  MEDICATIONS: Current Outpatient Medications on File Prior to Visit  Medication Sig Dispense Refill   acetaminophen (TYLENOL) 500 MG tablet Take 500-1,000 mg by mouth every 8 (eight) hours as needed for moderate pain or headache.     albuterol (VENTOLIN HFA) 108 (90 Base) MCG/ACT inhaler Inhale 1-2 puffs into the lungs every 6 (six) hours as needed for wheezing or shortness of breath.     alendronate (FOSAMAX) 70 MG tablet Take 70 mg by mouth every Sunday. Take with a full glass of water on an empty stomach.     ammonium lactate (AMLACTIN) 12 % lotion Apply 1 application topically as needed for dry skin. (Patient taking differently: Apply 1 application topically daily as needed for dry skin.) 400 g 0   aspirin EC 81 MG tablet Take 81 mg by mouth daily. Swallow whole.     ciclopirox (PENLAC) 8 % solution Apply topically at bedtime. Apply over nail and surrounding skin. Apply daily over previous coat. After seven (7) days, may remove with alcohol and continue cycle. (Patient not taking: Reported on 10/28/2020) 6.6 mL 0   empagliflozin (JARDIANCE) 10 MG TABS tablet Take 10 mg by mouth daily.     famotidine (PEPCID) 20 MG tablet Take 20 mg by mouth 2 (two) times daily.     furosemide (LASIX) 20 MG tablet Take 20 mg  by mouth daily.     INCRUSE ELLIPTA 62.5 MCG/INH AEPB Inhale 1 puff into the lungs daily.     JANUVIA 100 MG tablet Take 100 mg by mouth daily.     levothyroxine (SYNTHROID) 125 MCG tablet Take 125 mcg by mouth daily before breakfast.     linaclotide (LINZESS) 72 MCG capsule Take 72 mcg by mouth daily before breakfast.     losartan (COZAAR) 100 MG tablet Take 50 mg by mouth daily.     metoprolol succinate (TOPROL-XL) 50 MG 24 hr tablet Take 50 mg by mouth daily.  Take with or immediately following a meal.     montelukast (SINGULAIR) 10 MG tablet Take 10 mg by mouth at bedtime.     Polyethyl Glycol-Propyl Glycol (SYSTANE) 0.4-0.3 % SOLN Place 1 drop into both eyes daily as needed (dry eyes).     rosuvastatin (CRESTOR) 10 MG tablet Take 10 mg by mouth daily.     Vitamin D, Ergocalciferol, (DRISDOL) 1.25 MG (50000 UNIT) CAPS capsule Take 50,000 Units by mouth every Saturday.     No current facility-administered medications on file prior to visit.    ALLERGIES: Allergies  Allergen Reactions   Ace Inhibitors Other (See Comments)    cough   Azithromycin Other (See Comments)    Unaware     FAMILY HISTORY: No family history on file.  Objective:  Blood pressure 132/80, pulse 86, height 5\' 5"  (1.651 m), weight 252 lb (114.3 kg), SpO2 96 %. General: No acute distress.  Patient appears well-groomed.   Head:  Normocephalic/atraumatic Eyes:  fundi examined but not visualized Neck: supple, no paraspinal tenderness, full range of motion Back: No paraspinal tenderness Heart: regular rate and rhythm Lungs: Clear to auscultation bilaterally. Vascular: No carotid bruits. Neurological Exam: Mental status: alert and oriented to person, place, and time, recent and remote memory intact, fund of knowledge intact, attention and concentration intact, speech fluent and not dysarthric, language intact. Cranial nerves: CN I: not tested CN II: pupils equal, round and reactive to light, visual fields intact CN III, IV, VI:  full range of motion, no nystagmus, no ptosis CN V: facial sensation intact. CN VII: upper and lower face symmetric CN VIII: hearing intact CN IX, X: gag intact, uvula midline CN XI: sternocleidomastoid and trapezius muscles intact CN XII: tongue midline Bulk & Tone: normal, no fasciculations. Motor:  muscle strength 5/5 throughout Sensation:  Pinprick and vibratory sensation reduced in feet Deep Tendon Reflexes:  trace upper extremities,  absent lower extremities   toes downgoing.   Finger to nose testing:  Without dysmetria.   Heel to shin:  Without dysmetria.   Gait:  Wide-based and cautious.  Romberg with sway    Thank you for allowing me to take part in the care of this patient.  Metta Clines, DO  CC: Shon Baton, MD

## 2020-12-22 ENCOUNTER — Encounter: Payer: Self-pay | Admitting: Neurology

## 2020-12-22 ENCOUNTER — Other Ambulatory Visit: Payer: Self-pay

## 2020-12-22 ENCOUNTER — Ambulatory Visit (INDEPENDENT_AMBULATORY_CARE_PROVIDER_SITE_OTHER): Payer: Medicare Other | Admitting: Neurology

## 2020-12-22 VITALS — BP 132/80 | HR 86 | Ht 65.0 in | Wt 252.0 lb

## 2020-12-22 DIAGNOSIS — Z8673 Personal history of transient ischemic attack (TIA), and cerebral infarction without residual deficits: Secondary | ICD-10-CM

## 2020-12-22 DIAGNOSIS — R42 Dizziness and giddiness: Secondary | ICD-10-CM

## 2020-12-22 DIAGNOSIS — E1142 Type 2 diabetes mellitus with diabetic polyneuropathy: Secondary | ICD-10-CM

## 2020-12-22 DIAGNOSIS — E785 Hyperlipidemia, unspecified: Secondary | ICD-10-CM

## 2020-12-22 DIAGNOSIS — I10 Essential (primary) hypertension: Secondary | ICD-10-CM | POA: Diagnosis not present

## 2020-12-22 DIAGNOSIS — N390 Urinary tract infection, site not specified: Secondary | ICD-10-CM

## 2020-12-22 NOTE — Patient Instructions (Signed)
I think that your symptoms were due to low blood pressure and the urinary tract infection.  I don't think you had another stroke

## 2022-01-04 ENCOUNTER — Other Ambulatory Visit: Payer: Self-pay | Admitting: Family Medicine

## 2022-01-04 ENCOUNTER — Other Ambulatory Visit: Payer: Self-pay | Admitting: Internal Medicine

## 2022-01-04 DIAGNOSIS — Z1231 Encounter for screening mammogram for malignant neoplasm of breast: Secondary | ICD-10-CM

## 2022-01-26 ENCOUNTER — Ambulatory Visit: Payer: Medicare Other

## 2022-02-09 ENCOUNTER — Inpatient Hospital Stay (HOSPITAL_COMMUNITY)
Admission: EM | Admit: 2022-02-09 | Discharge: 2022-02-16 | DRG: 291 | Disposition: A | Discharge status: Home Health | Payer: Medicare Other | Source: Ambulatory Visit | Attending: Internal Medicine | Admitting: Internal Medicine

## 2022-02-09 ENCOUNTER — Emergency Department (HOSPITAL_BASED_OUTPATIENT_CLINIC_OR_DEPARTMENT_OTHER): Payer: Medicare Other

## 2022-02-09 ENCOUNTER — Emergency Department (HOSPITAL_COMMUNITY): Payer: Medicare Other

## 2022-02-09 ENCOUNTER — Other Ambulatory Visit: Payer: Self-pay

## 2022-02-09 ENCOUNTER — Encounter (HOSPITAL_COMMUNITY): Payer: Self-pay | Admitting: Emergency Medicine

## 2022-02-09 DIAGNOSIS — E876 Hypokalemia: Secondary | ICD-10-CM

## 2022-02-09 DIAGNOSIS — M7989 Other specified soft tissue disorders: Secondary | ICD-10-CM

## 2022-02-09 DIAGNOSIS — I10 Essential (primary) hypertension: Secondary | ICD-10-CM

## 2022-02-09 DIAGNOSIS — I5033 Acute on chronic diastolic (congestive) heart failure: Secondary | ICD-10-CM | POA: Diagnosis present

## 2022-02-09 DIAGNOSIS — J449 Chronic obstructive pulmonary disease, unspecified: Principal | ICD-10-CM

## 2022-02-09 DIAGNOSIS — I110 Hypertensive heart disease with heart failure: Principal | ICD-10-CM | POA: Diagnosis present

## 2022-02-09 DIAGNOSIS — J441 Chronic obstructive pulmonary disease with (acute) exacerbation: Secondary | ICD-10-CM | POA: Diagnosis present

## 2022-02-09 DIAGNOSIS — E119 Type 2 diabetes mellitus without complications: Secondary | ICD-10-CM

## 2022-02-09 DIAGNOSIS — J810 Acute pulmonary edema: Secondary | ICD-10-CM

## 2022-02-09 DIAGNOSIS — R0602 Shortness of breath: Secondary | ICD-10-CM

## 2022-02-09 DIAGNOSIS — J9601 Acute respiratory failure with hypoxia: Secondary | ICD-10-CM | POA: Diagnosis not present

## 2022-02-09 DIAGNOSIS — J9621 Acute and chronic respiratory failure with hypoxia: Secondary | ICD-10-CM | POA: Diagnosis present

## 2022-02-09 DIAGNOSIS — Z833 Family history of diabetes mellitus: Secondary | ICD-10-CM

## 2022-02-09 DIAGNOSIS — R0902 Hypoxemia: Secondary | ICD-10-CM

## 2022-02-09 DIAGNOSIS — E039 Hypothyroidism, unspecified: Secondary | ICD-10-CM

## 2022-02-09 DIAGNOSIS — Z87891 Personal history of nicotine dependence: Secondary | ICD-10-CM

## 2022-02-09 DIAGNOSIS — E7800 Pure hypercholesterolemia, unspecified: Secondary | ICD-10-CM | POA: Diagnosis not present

## 2022-02-09 DIAGNOSIS — I272 Pulmonary hypertension, unspecified: Secondary | ICD-10-CM

## 2022-02-09 DIAGNOSIS — Z6841 Body Mass Index (BMI) 40.0 and over, adult: Secondary | ICD-10-CM

## 2022-02-09 LAB — CBC
HCT: 39.5 % (ref 36.0–46.0)
Hemoglobin: 12.9 g/dL (ref 12.0–15.0)
MCH: 25.7 pg — ABNORMAL LOW (ref 26.0–34.0)
MCHC: 32.7 g/dL (ref 30.0–36.0)
MCV: 78.7 fL — ABNORMAL LOW (ref 80.0–100.0)
Platelets: 277 10*3/uL (ref 150–400)
RBC: 5.02 MIL/uL (ref 3.87–5.11)
RDW: 17.4 % — ABNORMAL HIGH (ref 11.5–15.5)
WBC: 9.8 10*3/uL (ref 4.0–10.5)
nRBC: 0.3 % — ABNORMAL HIGH (ref 0.0–0.2)

## 2022-02-09 LAB — BRAIN NATRIURETIC PEPTIDE: B Natriuretic Peptide: 49.9 pg/mL (ref 0.0–100.0)

## 2022-02-09 LAB — BASIC METABOLIC PANEL
Anion gap: 6 (ref 5–15)
BUN: 6 mg/dL — ABNORMAL LOW (ref 8–23)
CO2: 26 mmol/L (ref 22–32)
Calcium: 9.4 mg/dL (ref 8.9–10.3)
Chloride: 108 mmol/L (ref 98–111)
Creatinine, Ser: 0.73 mg/dL (ref 0.44–1.00)
GFR, Estimated: >60 mL/min (ref >60)
Glucose, Bld: 171 mg/dL — ABNORMAL HIGH (ref 70–99)
Potassium: 3.4 mmol/L — ABNORMAL LOW (ref 3.5–5.1)
Sodium: 140 mmol/L (ref 135–145)

## 2022-02-09 LAB — CBG MONITORING, ED: Glucose-Capillary: 204 mg/dL — ABNORMAL HIGH (ref 70–99)

## 2022-02-09 LAB — TROPONIN I (HIGH SENSITIVITY)
Troponin I (High Sensitivity): 5 ng/L (ref <18)
Troponin I (High Sensitivity): 5 ng/L (ref <18)

## 2022-02-09 LAB — D-DIMER, QUANTITATIVE: D-Dimer, Quant: 0.97 ug/mL-FEU — ABNORMAL HIGH (ref 0.00–0.50)

## 2022-02-09 MED ORDER — ACETAMINOPHEN 325 MG PO TABS
650.0000 mg | ORAL_TABLET | Freq: Four times a day (QID) | ORAL | Status: DC | PRN
  Administered 2022-02-09 – 2022-02-16 (×6): 650 mg via ORAL
  Filled 2022-02-09 (×6): qty 2

## 2022-02-09 MED ORDER — ROSUVASTATIN CALCIUM 5 MG PO TABS
10.0000 mg | ORAL_TABLET | Freq: Every day | ORAL | Status: DC
  Administered 2022-02-09 – 2022-02-16 (×8): 10 mg via ORAL
  Filled 2022-02-09 (×8): qty 2

## 2022-02-09 MED ORDER — SODIUM CHLORIDE 0.9% FLUSH
3.0000 mL | Freq: Two times a day (BID) | INTRAVENOUS | Status: DC
  Administered 2022-02-09 – 2022-02-16 (×14): 3 mL via INTRAVENOUS

## 2022-02-09 MED ORDER — MONTELUKAST SODIUM 10 MG PO TABS
10.0000 mg | ORAL_TABLET | Freq: Every day | ORAL | Status: DC
  Administered 2022-02-09 – 2022-02-15 (×7): 10 mg via ORAL
  Filled 2022-02-09 (×7): qty 1

## 2022-02-09 MED ORDER — AMMONIUM LACTATE 12 % EX LOTN: 1.0000 | TOPICAL_LOTION | CUTANEOUS | Status: DC | PRN

## 2022-02-09 MED ORDER — FAMOTIDINE 20 MG PO TABS
20.0000 mg | ORAL_TABLET | Freq: Two times a day (BID) | ORAL | Status: DC
  Administered 2022-02-09 – 2022-02-16 (×14): 20 mg via ORAL
  Filled 2022-02-09 (×14): qty 1

## 2022-02-09 MED ORDER — UMECLIDINIUM BROMIDE 62.5 MCG/ACT IN AEPB: 1.0000 | INHALATION_SPRAY | Freq: Every day | RESPIRATORY_TRACT | Status: DC

## 2022-02-09 MED ORDER — HEPARIN BOLUS VIA INFUSION
5500.0000 [IU] | Freq: Once | INTRAVENOUS | Status: AC
  Administered 2022-02-09: 5500 [IU] via INTRAVENOUS
  Filled 2022-02-09: qty 5500

## 2022-02-09 MED ORDER — POTASSIUM CHLORIDE CRYS ER 20 MEQ PO TBCR
40.0000 meq | EXTENDED_RELEASE_TABLET | Freq: Every day | ORAL | Status: DC
  Administered 2022-02-09 – 2022-02-16 (×8): 40 meq via ORAL
  Filled 2022-02-09 (×8): qty 2

## 2022-02-09 MED ORDER — POLYVINYL ALCOHOL 1.4 % OP SOLN
1.0000 [drp] | Freq: Every day | OPHTHALMIC | Status: DC | PRN
Start: 2022-02-09 — End: 2022-02-16

## 2022-02-09 MED ORDER — ONDANSETRON HCL 4 MG/2ML IJ SOLN: 4.0000 mg | Freq: Four times a day (QID) | INTRAMUSCULAR | Status: DC | PRN

## 2022-02-09 MED ORDER — IOHEXOL 350 MG/ML SOLN
100.0000 mL | Freq: Once | INTRAVENOUS | Status: AC | PRN
Start: 2022-02-09 — End: 2022-02-09
  Administered 2022-02-09: 100 mL via INTRAVENOUS

## 2022-02-09 MED ORDER — IPRATROPIUM-ALBUTEROL 0.5-2.5 (3) MG/3ML IN SOLN: 3.0000 mL | RESPIRATORY_TRACT | Status: DC | PRN

## 2022-02-09 MED ORDER — ENOXAPARIN SODIUM 40 MG/0.4ML IJ SOSY
40.0000 mg | PREFILLED_SYRINGE | INTRAMUSCULAR | Status: DC
  Administered 2022-02-09 – 2022-02-15 (×7): 40 mg via SUBCUTANEOUS
  Filled 2022-02-09 (×7): qty 0.4

## 2022-02-09 MED ORDER — METOPROLOL SUCCINATE ER 25 MG PO TB24
50.0000 mg | ORAL_TABLET | Freq: Every day | ORAL | Status: DC
  Administered 2022-02-09 – 2022-02-16 (×8): 50 mg via ORAL
  Filled 2022-02-09 (×9): qty 2

## 2022-02-09 MED ORDER — FUROSEMIDE 10 MG/ML IJ SOLN
40.0000 mg | Freq: Every day | INTRAMUSCULAR | Status: DC
  Administered 2022-02-10 – 2022-02-12 (×3): 40 mg via INTRAVENOUS
  Filled 2022-02-09 (×3): qty 4

## 2022-02-09 MED ORDER — ACETAMINOPHEN 500 MG PO TABS: 500.0000 mg | ORAL_TABLET | Freq: Three times a day (TID) | ORAL | Status: DC | PRN

## 2022-02-09 MED ORDER — SODIUM CHLORIDE 0.9% FLUSH: 3.0000 mL | INTRAVENOUS | Status: DC | PRN

## 2022-02-09 MED ORDER — ASPIRIN 81 MG PO TBEC
81.0000 mg | DELAYED_RELEASE_TABLET | Freq: Every day | ORAL | Status: DC
  Administered 2022-02-09 – 2022-02-16 (×8): 81 mg via ORAL
  Filled 2022-02-09 (×8): qty 1

## 2022-02-09 MED ORDER — LEVOTHYROXINE SODIUM 100 MCG PO TABS
100.0000 ug | ORAL_TABLET | Freq: Every day | ORAL | Status: DC
  Administered 2022-02-10 – 2022-02-16 (×7): 100 ug via ORAL
  Filled 2022-02-09 (×7): qty 1

## 2022-02-09 MED ORDER — VITAMIN D (ERGOCALCIFEROL) 1.25 MG (50000 UNIT) PO CAPS
50000.0000 [IU] | ORAL_CAPSULE | ORAL | Status: DC
  Administered 2022-02-11: 50000 [IU] via ORAL
  Filled 2022-02-09: qty 1

## 2022-02-09 MED ORDER — INSULIN ASPART 100 UNIT/ML IJ SOLN
0.0000 [IU] | Freq: Three times a day (TID) | INTRAMUSCULAR | Status: DC
  Administered 2022-02-10: 1 [IU] via SUBCUTANEOUS
  Administered 2022-02-10: 2 [IU] via SUBCUTANEOUS
  Administered 2022-02-11 (×2): 3 [IU] via SUBCUTANEOUS
  Administered 2022-02-12 (×3): 2 [IU] via SUBCUTANEOUS
  Administered 2022-02-13: 5 [IU] via SUBCUTANEOUS
  Administered 2022-02-13: 2 [IU] via SUBCUTANEOUS
  Administered 2022-02-13: 3 [IU] via SUBCUTANEOUS
  Administered 2022-02-14: 7 [IU] via SUBCUTANEOUS
  Administered 2022-02-14: 5 [IU] via SUBCUTANEOUS
  Administered 2022-02-14 – 2022-02-15 (×2): 3 [IU] via SUBCUTANEOUS
  Administered 2022-02-15: 7 [IU] via SUBCUTANEOUS
  Administered 2022-02-15: 3 [IU] via SUBCUTANEOUS
  Administered 2022-02-16: 7 [IU] via SUBCUTANEOUS
  Administered 2022-02-16: 5 [IU] via SUBCUTANEOUS
  Administered 2022-02-16: 9 [IU] via SUBCUTANEOUS

## 2022-02-09 MED ORDER — LINACLOTIDE 72 MCG PO CAPS
72.0000 ug | ORAL_CAPSULE | Freq: Every day | ORAL | Status: DC
  Administered 2022-02-10 – 2022-02-16 (×7): 72 ug via ORAL
  Filled 2022-02-09 (×9): qty 1

## 2022-02-09 MED ORDER — CICLOPIROX 8 % EX SOLN: Freq: Every day | CUTANEOUS | Status: DC

## 2022-02-09 MED ORDER — ALENDRONATE SODIUM 70 MG PO TABS: 70.0000 mg | ORAL_TABLET | ORAL | Status: DC

## 2022-02-09 MED ORDER — POLYETHYLENE GLYCOL 3350 17 G PO PACK: 17.0000 g | PACK | Freq: Every day | ORAL | Status: DC | PRN

## 2022-02-09 MED ORDER — ONDANSETRON HCL 4 MG PO TABS: 4.0000 mg | ORAL_TABLET | Freq: Four times a day (QID) | ORAL | Status: DC | PRN

## 2022-02-09 MED ORDER — FUROSEMIDE 10 MG/ML IJ SOLN
40.0000 mg | Freq: Once | INTRAMUSCULAR | Status: AC
  Administered 2022-02-09: 40 mg via INTRAVENOUS
  Filled 2022-02-09: qty 4

## 2022-02-09 MED ORDER — HEPARIN (PORCINE) 25000 UT/250ML-% IV SOLN
1600.0000 [IU]/h | INTRAVENOUS | Status: DC
  Administered 2022-02-09 (×2): 1600 [IU]/h via INTRAVENOUS
  Filled 2022-02-09: qty 250

## 2022-02-09 MED ORDER — SODIUM CHLORIDE 0.9 % IV SOLN: 250.0000 mL | INTRAVENOUS | Status: DC | PRN

## 2022-02-09 MED ORDER — LOSARTAN POTASSIUM 50 MG PO TABS
50.0000 mg | ORAL_TABLET | Freq: Every day | ORAL | Status: DC
  Administered 2022-02-09 – 2022-02-16 (×8): 50 mg via ORAL
  Filled 2022-02-09 (×8): qty 1

## 2022-02-09 MED ORDER — ACETAMINOPHEN 650 MG RE SUPP: 650.0000 mg | Freq: Four times a day (QID) | RECTAL | Status: DC | PRN

## 2022-02-09 MED ORDER — INSULIN ASPART 100 UNIT/ML IJ SOLN
0.0000 [IU] | Freq: Every day | INTRAMUSCULAR | Status: DC
  Administered 2022-02-09 – 2022-02-13 (×4): 2 [IU] via SUBCUTANEOUS
  Administered 2022-02-14 – 2022-02-15 (×2): 5 [IU] via SUBCUTANEOUS

## 2022-02-09 NOTE — ED Provider Notes (Signed)
[facility] EMERGENCY DEPARTMENT Provider Note   CSN: [id number] Arrival date & time: 02/09/22  1133     History  Chief Complaint  Patient presents with   Shortness of Breath    Joy Becker is a 71 y.o. female.   Shortness of Breath  Patient has a history of COPD, hypothyroidism, diabetes, hypertension, hypercholesterolemia.  Patient denies using oxygen at home normally.  She states over the last 2 days she has noticed some shortness of breath.  Any minimal activity is making her more short of breath.  She does have chronic leg swelling, greater in the left leg, this might be a little bit worse than usual.  She is not having any chest pain.  No fevers.  Patient went to the doctor's office today and was noted to have a low oxygen saturation at 79%.  Patient was started on 3 L of nasal cannula and sent to the ED for further evaluation    Home Medications Prior to Admission medications   Medication Sig Start Date End Date Taking? Authorizing Provider  acetaminophen (TYLENOL) 500 MG tablet Take 500-1,000 mg by mouth every 8 (eight) hours as needed for moderate pain or headache.    [provider]  albuterol (VENTOLIN HFA) 108 (90 Base) MCG/ACT inhaler Inhale 1-2 puffs into the lungs every 6 (six) hours as needed for wheezing or shortness of breath. 10/21/19   [provider]  alendronate (FOSAMAX) 70 MG tablet Take 70 mg by mouth every Sunday. Take with a full glass of water on an empty stomach.    [provider]  ammonium lactate (AMLACTIN) 12 % lotion Apply 1 application topically as needed for dry skin. Patient taking differently: Apply 1 application topically daily as needed for dry skin. 08/22/19   Felipa Furnace, DPM  aspirin EC 81 MG tablet Take 81 mg by mouth daily. Swallow whole.    [provider]  ciclopirox (PENLAC) 8 % solution Apply topically at bedtime. Apply over nail and surrounding skin. Apply daily over previous  coat. After seven (7) days, may remove with alcohol and continue cycle. Patient not taking: No sig reported 08/22/19   Felipa Furnace, DPM  empagliflozin (JARDIANCE) 10 MG TABS tablet Take 10 mg by mouth daily.    [provider]  famotidine (PEPCID) 20 MG tablet Take 20 mg by mouth 2 (two) times daily. 10/16/19   [provider]  furosemide (LASIX) 20 MG tablet Take 20 mg by mouth daily.    [provider]  INCRUSE ELLIPTA 62.5 MCG/INH AEPB Inhale 1 puff into the lungs daily. 08/06/19   [provider]  JANUVIA 100 MG tablet Take 100 mg by mouth daily. 08/09/19   [provider]  levothyroxine (SYNTHROID) 125 MCG tablet Take 125 mcg by mouth daily before breakfast.    [provider]  linaclotide (LINZESS) 72 MCG capsule Take 72 mcg by mouth daily before breakfast.    [provider]  losartan (COZAAR) 100 MG tablet Take 50 mg by mouth daily.    [provider]  metoprolol succinate (TOPROL-XL) 50 MG 24 hr tablet Take 50 mg by mouth daily. Take with or immediately following a meal.    [provider]  montelukast (SINGULAIR) 10 MG tablet Take 10 mg by mouth at bedtime.    [provider]  Polyethyl Glycol-Propyl Glycol (SYSTANE) 0.4-0.3 % SOLN Place 1 drop into both eyes daily as needed (dry eyes).    [provider]  rosuvastatin (CRESTOR) 10 MG tablet Take 10 mg by mouth daily.    [provider]  Vitamin D, Ergocalciferol, (DRISDOL) 1.25 MG (50000 UNIT) CAPS capsule Take 50,000 Units by mouth every Saturday.    [provider]      Allergies    Ace inhibitors and Azithromycin    Review of Systems   Review of Systems  Respiratory:  Positive for shortness of breath.     Physical Exam Updated Vital Signs BP 123/62 (BP Location: Right Arm)   Pulse 75   Temp 98.7 F (37.1 C) (Oral)   Resp 20   SpO2 95%  Physical Exam Vitals and nursing note reviewed.  Constitutional:       General: She is not in acute distress.    Appearance: She is well-developed.  HENT:     Head: Normocephalic and atraumatic.     Right Ear: External ear normal.     Left Ear: External ear normal.  Eyes:     General: No scleral icterus.       Right eye: No discharge.        Left eye: No discharge.     Conjunctiva/sclera: Conjunctivae normal.  Neck:     Trachea: No tracheal deviation.  Cardiovascular:     Rate and Rhythm: Normal rate and regular rhythm.  Pulmonary:     Effort: Pulmonary effort is normal. No respiratory distress.     Breath sounds: No stridor. Rales present. No wheezing.  Abdominal:     General: Bowel sounds are normal. There is no distension.     Palpations: Abdomen is soft.     Tenderness: There is no abdominal tenderness. There is no guarding or rebound.  Musculoskeletal:        General: No tenderness or deformity.     Cervical back: Neck supple.     Right lower leg: Edema present.     Left lower leg: Edema present.     Comments: Asymmetric edema left greater than right  Skin:    General: Skin is warm and dry.     Findings: No rash.  Neurological:     General: No focal deficit present.     Mental Status: She is alert.     Cranial Nerves: No cranial nerve deficit (no facial droop, extraocular movements intact, no slurred speech).     Sensory: No sensory deficit.     Motor: No abnormal muscle tone or seizure activity.     Coordination: Coordination normal.  Psychiatric:        Mood and Affect: Mood normal.     ED Results / Procedures / Treatments   Labs (all labs ordered are listed, but only abnormal results are displayed) Labs Reviewed  BASIC METABOLIC PANEL - Abnormal; Notable for the following components:      Result Value   Potassium 3.4 (*)    Glucose, Bld 171 (*)    BUN 6 (*)    All other components within normal limits  CBC - Abnormal; Notable for the following components:   MCV 78.7 (*)    MCH 25.7 (*)    RDW 17.4 (*)    nRBC 0.3 (*)     All other components within normal limits  BRAIN NATRIURETIC PEPTIDE  D-DIMER, QUANTITATIVE  TROPONIN I (HIGH SENSITIVITY)    EKG EKG Interpretation  Date/Time:  Thursday February 09 2022 11:39:19 EDT Ventricular Rate:  78 PR Interval:  172 QRS Duration: 86 QT Interval:  394  QTC Calculation: 449 R Axis:   -13 Text Interpretation: Normal sinus rhythm Possible Anterior infarct , age undetermined Abnormal ECG When compared with ECG of 01-Oct-2020 21:09, PREVIOUS ECG IS PRESENT No significant change since last tracing Confirmed by Dorie Rank [id number]) on 02/09/2022 12:54:30 PM  Radiology DG Chest 2 View  Result Date: 02/09/2022 CLINICAL DATA:  shortness of breath EXAM: CHEST - 2 VIEW COMPARISON:  None Available. FINDINGS: Interstitial prominence. Top-normal heart size. No significant pleural effusion. No pneumothorax. No acute osseous abnormality. IMPRESSION: Age-indeterminate interstitial prominence may be chronic or reflect edema. Electronically Signed   By: Macy Mis M.D.   On: 02/09/2022 12:17    Procedures Procedures    Medications Ordered in ED Medications - No data to display  ED Course/ Medical Decision Making/ A&P Clinical Course as of 02/11/22 1243  Thu Feb 09, 2022  1421 VAS [place] LOWER EXTREMITY VENOUS (DVT) (7a-7p) Doppler study negative for DVT [JK]  1421 DG Chest 2 View Supple edema noted on chest x-ray [JK]  1531 D-dimer, quantitative(!) Dimer elevated at 0.97 [JK]  1531 Troponin I (High Sensitivity) Troponins normal [JK]  1531 Brain natriuretic peptide BNP not elevated [JK]  1532 With elevated D-dimer will proceed with CT angiogram.  Patient still has oxygen requirement. [JK]    Clinical Course User Index [JK] Dorie Rank, MD                           Medical Decision Making DDX PE, CHF, COPD, PNA  Amount and/or Complexity of Data Reviewed Labs: ordered. Decision-making details documented in ED Course. Radiology: ordered and independent interpretation  performed. Decision-making details documented in ED Course.  Risk Prescription drug management. Decision regarding hospitalization.   Pt presented to the ED with complaints of dyspnea.  Now oxygen requirement noted in the ED.  Pt not usually on home o2.  Edema noted on exam but no signficant pulm edema on CXR.  BNP negative.  D dimer elevated.  CT PE study ordered. Heparin started empirically.  Can dc if pe study negative.  Pending at shift change.  Will need admission due to new o2 requirement.  Care turned over to Dr Maryan Rued.        Final Clinical Impression(s) / ED Diagnoses pending  Rx / DC Orders ED Discharge Orders     None         Dorie Rank, MD 02/11/22 1246

## 2022-02-09 NOTE — ED Provider Notes (Signed)
I assumed care from Dr. Tomi Bamberger patient with new hypoxia with oxygen requirement.  Patient is negative for DVT but did have an elevated D-dimer.  Pending CTA.  Patient CTA today shows no evidence of PE but does show findings concerning for pulmonary hypertension, cardiomegaly and diffuse patchy groundglass opacities which may represent pulmonary edema.  Patient is not having any infectious symptoms at this time to suggest pneumonia.  She reports she has been taking 2 furosemide every other day and 1 on the off days.  She has edema in the left lower extremity but not significant edema in the right.  She is requiring 4 L of oxygen to maintain O2 sats at 93%.  No significant wheezing at this time.  We will give furosemide, discontinue heparin and admit for further care.   Blanchie Dessert, MD 02/09/22 1902

## 2022-02-09 NOTE — H&P (Addendum)
History and Physical    Patient: Joy Becker CXK:[id number] DOB: 1950-07-16 DOA: 02/09/2022 DOS: the patient was seen and examined on 02/09/2022 PCP: Shon Baton, MD  Patient coming from: Home  Chief Complaint:  Chief Complaint  Patient presents with   Shortness of Breath   HPI: Joy Becker is a 71 y.o. female  with past medical history of COPD not on oxygen, hypothyroidism, diabetes mellitus, hypertension, hyperlipidemia presented to the hospital with progressive shortness of breath mostly over the last 2 days to the point where minimal activity was causing her short winded.  She does have some chronic leg swelling more on the left than the right and states that it might be a little worse than usual.  Patient denies any chest pain, fever, chills or rigor.  Has mild cough without sputum production.  Patient had gone to her primary care physician when she was noted to have pulse ox of 79% on room air and was put on 3 L of oxygen and was sent to the ED for further evaluation.  Patient has mild dizziness and fatigue but denies lightheadedness, syncope.  Denies any nausea, vomiting, abdominal pain or, diarrhea.  Denies any urinary urgency, frequency or dysuria.  Denies any sick contacts or recent travel.  In the ED, patient was noted to have mild edema of the lower extremities.  Vitals notable for tachypnea, hypoxia needing 2 L of oxygen.  Lab data showed a potassium of 3.4.  EKG showed normal sinus rhythm.  Chest x-ray shows interstitial prominence.  CTA chest was done which showed enlargement of the pulmonary artery with diffuse patchy groundglass opacities representing possible pulmonary edema or infection.  Mild bilateral hilar lymphadenopathy was also noted.  Patient was then considered for admission to the hospital for further evaluation and treatment.  Assessment and plan  Principal Problem:   Acute respiratory failure with hypoxia (HCC) Active Problems:   High cholesterol   Diabetes  mellitus without complication (HCC)   Hypertension   Hypothyroidism   COPD (chronic obstructive pulmonary disease) (HCC)   Shortness of breath   Hypokalemia   Shortness of breath, dyspnea, acute hypoxic respiratory failure.  Patient with history of COPD.  CTA showing pulmonary hypertension.  Possibility of COPD with right-sided heart failure/cor pulmonale.  Continue oxygen, nebulizers, treatment for COPD.  BNP was 49.  Check 2D echocardiogram.  CTA chest negative for pulmonary embolism but some infiltrate in the lungs with possibility of congestion..  No signs of infection.  Received IV Lasix in the ED.  We will continue with IV Lasix,  Intake and output charting, daily weights, fluid restriction.  Continue bronchodilators.  No indication for steroids at this time.  Will wean oxygen as able.  Hypokalemia.  Mild.  We will replenish.  Check levels in AM.  Diabetes mellitus type II.  Patient is on [place] at home.  We will continue sliding scale insulin while in the hospital.  Essential hypertension.  On metoprolol and losartan at home at home.  We will continue.  Hypothyroidism.  Continue Synthroid.  Question history of CHF.  Patient takes Lasix at home and states for lower extremity edema. Check 2D echocardiogram.  On IV Lasix, Monitor intake and output charting, Daily weights.  Low-salt diet and fluid restriction.  Continue aspirin, metoprolol, losartan, Crestor    Review of Systems: As mentioned in the history of present illness. All other systems reviewed and are negative.  Past Medical History:  Diagnosis Date   COPD (chronic  obstructive pulmonary disease) (HCC)    Diabetes mellitus without complication (HCC)    High cholesterol    Hypertension    Hypothyroidism    Vertigo    History reviewed. No pertinent surgical history. Social History:  reports that she has quit smoking. Her smoking use included cigarettes. She has quit using smokeless tobacco. She reports that she  does not drink alcohol and does not use drugs.  Allergies  Allergen Reactions   Ace Inhibitors Other (See Comments)    cough   Azithromycin Other (See Comments)    Unaware     Family History  Problem Relation Age of Onset   Diabetes Mother    Aneurysm Mother     Prior to Admission medications   Medication Sig Start Date End Date Taking? Authorizing Provider  acetaminophen (TYLENOL) 500 MG tablet Take 500-1,000 mg by mouth every 8 (eight) hours as needed for moderate pain or headache.    [provider]  albuterol (VENTOLIN HFA) 108 (90 Base) MCG/ACT inhaler Inhale 1-2 puffs into the lungs every 6 (six) hours as needed for wheezing or shortness of breath. 10/21/19   [provider]  alendronate (FOSAMAX) 70 MG tablet Take 70 mg by mouth every Sunday. Take with a full glass of water on an empty stomach.    [provider]  ammonium lactate (AMLACTIN) 12 % lotion Apply 1 application topically as needed for dry skin. Patient taking differently: Apply 1 application topically daily as needed for dry skin. 08/22/19   Felipa Furnace, DPM  aspirin EC 81 MG tablet Take 81 mg by mouth daily. Swallow whole.    [provider]  ciclopirox (PENLAC) 8 % solution Apply topically at bedtime. Apply over nail and surrounding skin. Apply daily over previous coat. After seven (7) days, may remove with alcohol and continue cycle. Patient not taking: No sig reported 08/22/19   Felipa Furnace, DPM  empagliflozin (JARDIANCE) 10 MG TABS tablet Take 10 mg by mouth daily.    [provider]  famotidine (PEPCID) 20 MG tablet Take 20 mg by mouth 2 (two) times daily. 10/16/19   [provider]  furosemide (LASIX) 20 MG tablet Take 20 mg by mouth daily.    [provider]  INCRUSE ELLIPTA 62.5 MCG/INH AEPB Inhale 1 puff into the lungs daily. 08/06/19   [provider]  JANUVIA 100 MG tablet Take 100 mg by mouth daily. 08/09/19   [provider]   levothyroxine (SYNTHROID) 125 MCG tablet Take 125 mcg by mouth daily before breakfast.    [provider]  linaclotide (LINZESS) 72 MCG capsule Take 72 mcg by mouth daily before breakfast.    [provider]  losartan (COZAAR) 100 MG tablet Take 50 mg by mouth daily.    [provider]  metoprolol succinate (TOPROL-XL) 50 MG 24 hr tablet Take 50 mg by mouth daily. Take with or immediately following a meal.    [provider]  montelukast (SINGULAIR) 10 MG tablet Take 10 mg by mouth at bedtime.    [provider]  Polyethyl Glycol-Propyl Glycol (SYSTANE) 0.4-0.3 % SOLN Place 1 drop into both eyes daily as needed (dry eyes).    [provider]  rosuvastatin (CRESTOR) 10 MG tablet Take 10 mg by mouth daily.    [provider]  Vitamin D, Ergocalciferol, (DRISDOL) 1.25 MG (50000 UNIT) CAPS capsule Take 50,000 Units by mouth every Saturday.    [provider]  Physical Exam: Vitals:   02/09/22 1613 02/09/22 1630 02/09/22 1730 02/09/22 1830  BP: (!) 127/58 132/64 136/69 (!) 141/79  Pulse: 76 73 79 78  Resp: (!) 22 (!) 21 (!) 23 18  Temp:      TempSrc:      SpO2: 96% 96% 93% 91%  Weight: 118.4 kg     Body mass index is 43.43 kg/m.   General: Obese built, not in obvious distress HENT:   No scleral pallor or icterus noted. Oral mucosa is moist.  Chest:  Diminished breath sounds bilaterally.  CVS: S1 &S2 heard. No murmur.  Regular rate and rhythm. Abdomen: Soft, nontender, obese abdomen nondistended.  Bowel sounds are heard.   Extremities: No cyanosis, clubbing with bilateral pitting edema/chronic venous venous insufficiency, more on the left Psych: Alert, awake and oriented, normal mood CNS:  No cranial nerve deficits.  Power equal in all extremities.   Skin: Warm and dry.  No rashes noted.  Data Reviewed:    Latest Ref Rng & Units 02/09/2022   11:51 AM 10/01/2020    4:33 PM  CBC  WBC 4.0 - 10.5 K/uL 9.8  6.0    Hemoglobin 12.0 - 15.0 g/dL 12.9  15.9   Hematocrit 36.0 - 46.0 % 39.5  47.9   Platelets 150 - 400 K/uL 277  220        Latest Ref Rng & Units 02/09/2022   11:51 AM 10/01/2020    4:33 PM  BMP  Glucose 70 - 99 mg/dL 171  119   BUN 8 - 23 mg/dL 6  9   Creatinine 0.44 - 1.00 mg/dL 0.73  1.10   Sodium 135 - 145 mmol/L 140  136   Potassium 3.5 - 5.1 mmol/L 3.4  4.1   Chloride 98 - 111 mmol/L 108  104   CO2 22 - 32 mmol/L 26  25   Calcium 8.9 - 10.3 mg/dL 9.4  10.1       Advance Care Planning:   Code Status: Full Code   Consults: None  Family Communication: None at present  Severity of Illness: The appropriate patient status for this patient is OBSERVATION. Observation status is judged to be reasonable and necessary in order to provide the required intensity of service to ensure the patient's safety. The patient's presenting symptoms, physical exam findings, and initial radiographic and laboratory data in the context of their medical condition is felt to place them at decreased risk for further clinical deterioration. Furthermore, it is anticipated that the patient will be medically stable for discharge from the hospital within 2 midnights of admission.   Author: Flora Lipps, MD 02/09/2022 7:28 PM  For on call review [email or web address].

## 2022-02-09 NOTE — ED Notes (Signed)
Pt up to restroom via wheelchair. Noted pt's SpO2 dropped to 84% with activity while on 3L O2. SpO2 recovered quickly with rest. EDP notified.

## 2022-02-09 NOTE — ED Notes (Signed)
Patient transported to Ultrasound 

## 2022-02-09 NOTE — Hospital Course (Signed)
Patient is a 71 years old female with past medical history of COPD not on oxygen, hypothyroidism, diabetes mellitus, hypertension, hyperlipidemia presented to the hospital with progressive shortness of breath for several weeks but got worse over the last 2 days to the point where minimal activity was causing her short winded.  She does have some chronic leg swelling more on the left than the right and states that it might be a little worse than usual.  Patient denies any chest pain, fever, chills or rigor.  Has mild cough without sputum production.  Patient had gone to her primary care physician when she was noted to have pulse ox of 79% on room air and was put on 3 L of oxygen and was sent to the ED for further evaluation.  Denies any dizziness, lightheadedness, syncope.  Denies any nausea, vomiting, diarrhea.  Denies any urinary urgency, frequency or dysuria.  Denies any sick contacts or recent travel.  In the ED, patient was noted to have mild edema of the lower extremities.  Vitals were within normal range except for hypoxia needing 2 L of oxygen.  Lab data showed a potassium of 3.4.  KG showed normal sinus rhythm.  Chest x-ray shows interstitial prominence.  CTA chest was done which showed enlargement of the pulmonary artery with diffuse patchy groundglass opacities representing possible pulmonary edema or infection.  Mild bilateral hilar lymphadenopathy was also noted.  Patient was then considered for admission to the hospital for further evaluation and treatment.  Assessment and plan  Shortness of breath acute hypoxic respiratory failure.  Patient with history of COPD.  CTA showing pulmonary hypertension.  Possibility of COPD with right-sided heart failure/cor pulmonale.  Continue oxygen, nebulizers, treatment for COPD.  BNP was 49.  Check 2D echocardiogram.  CTA chest negative for pulmonary embolism but some infiltrate in the lungs.  No signs of infection.  Received IV Lasix in the ED.  We will  continue with the low-dose IV Lasix while in the hospital.  Hypokalemia.  Mild.  We will replenish.  Check levels in AM.  Diabetes mellitus type II.  Patient is on [place] at home.  We will continue sliding scale insulin while in the hospital.  Essential hypertension.  On metoprolol and losartan at home at home.  We will continue.  Hypothyroidism.  Continue Synthroid.  Question history of CHF.  Patient takes Lasix at home.  2D echocardiogram.  Will receive IV Lasix while in the hospital.

## 2022-02-09 NOTE — ED Provider Triage Note (Signed)
Emergency Medicine Provider Triage Evaluation Note  Joy Becker , a 71 y.o. female  was evaluated in triage.  Pt complains of SOB gradually worsening for the past three days. She was seen at her PCP office and was found to be hypoxic at 79%, palced on 3L and improved to 95%. Has a h/o COPD but does not wear O2 at home. H/o exertional SOB.   Review of Systems  Positive:  Negative:   Physical Exam  BP 123/62 (BP Location: Right Arm)   Pulse 75   Temp 98.7 F (37.1 C) (Oral)   Resp 20   SpO2 95%  Gen:   Awake, no distress   Resp:  Normal effort  MSK:   Moves extremities without difficulty  Other:  Diminished lung sounds. No respiratory distress.   Medical Decision Making  Medically screening exam initiated at 12:35 PM.  Appropriate orders placed.  Sterling Ucci was informed that the remainder of the evaluation will be completed by another provider, this initial triage assessment does not replace that evaluation, and the importance of remaining in the ED until their evaluation is complete.  Added on some labs from standard. Nursing aware that patient needs a room.    Sherrell Puller, PA-C 02/09/22 1237

## 2022-02-09 NOTE — ED Triage Notes (Signed)
Patient from [place] office w/ shortness of breath for 3 days. Patient experiences [facility] intermittently with exertion and at rest. Patient does not normally wear O2 but at MD office was found to be hypoxic at 79% and placed on 3 L of O2 improved to 95%. VS: 124/62, HR 80, Res 18

## 2022-02-09 NOTE — Progress Notes (Addendum)
ANTICOAGULATION CONSULT NOTE - Initial Consult  Pharmacy Consult for heparin Indication: pulmonary embolus  Allergies  Allergen Reactions   Ace Inhibitors Other (See Comments)    cough   Azithromycin Other (See Comments)    Unaware     Patient Measurements:   Heparin Dosing Weight: 84kg  Vital Signs: Temp: 98.7 F (37.1 C) (08/17 1136) Temp Source: Oral (08/17 1136) BP: 127/71 (08/17 1500) Pulse Rate: 72 (08/17 1500)  Labs: Recent Labs    02/09/22 1151 02/09/22 1418  HGB 12.9  --   HCT 39.5  --   PLT 277  --   CREATININE 0.73  --   TROPONINIHS 5 5    CrCl cannot be calculated (Unknown ideal weight.).   Medical History: Past Medical History:  Diagnosis Date   COPD (chronic obstructive pulmonary disease) ([place])    Diabetes mellitus without complication (HCC)    High cholesterol    Hypertension    Hypothyroidism    Vertigo     Assessment: 72 YOF presenting with SOB x 3d, concern for PE, she is not on anticoagulation PTA, CBC wnl  Goal of Therapy:  Heparin level 0.3-0.7 units/ml Monitor platelets by anticoagulation protocol: Yes   Plan:  Heparin 5500 units IV x 1, and gtt at 1600 units/hr F/u 6 hour heparin level F/u PE workup, long term [facility] plan  Bertis Ruddy, PharmD Clinical Pharmacist ED Pharmacist Phone # [phone] 02/09/2022 4:00 PM

## 2022-02-10 ENCOUNTER — Observation Stay (HOSPITAL_COMMUNITY): Payer: Medicare Other

## 2022-02-10 DIAGNOSIS — I10 Essential (primary) hypertension: Secondary | ICD-10-CM | POA: Diagnosis not present

## 2022-02-10 DIAGNOSIS — Z833 Family history of diabetes mellitus: Secondary | ICD-10-CM | POA: Diagnosis not present

## 2022-02-10 DIAGNOSIS — Z87891 Personal history of nicotine dependence: Secondary | ICD-10-CM | POA: Diagnosis not present

## 2022-02-10 DIAGNOSIS — E119 Type 2 diabetes mellitus without complications: Secondary | ICD-10-CM | POA: Diagnosis present

## 2022-02-10 DIAGNOSIS — E039 Hypothyroidism, unspecified: Secondary | ICD-10-CM | POA: Diagnosis present

## 2022-02-10 DIAGNOSIS — R0609 Other forms of dyspnea: Secondary | ICD-10-CM

## 2022-02-10 DIAGNOSIS — J9621 Acute and chronic respiratory failure with hypoxia: Secondary | ICD-10-CM | POA: Diagnosis present

## 2022-02-10 DIAGNOSIS — J9601 Acute respiratory failure with hypoxia: Secondary | ICD-10-CM | POA: Diagnosis present

## 2022-02-10 DIAGNOSIS — E876 Hypokalemia: Secondary | ICD-10-CM | POA: Diagnosis present

## 2022-02-10 DIAGNOSIS — J441 Chronic obstructive pulmonary disease with (acute) exacerbation: Secondary | ICD-10-CM | POA: Diagnosis present

## 2022-02-10 DIAGNOSIS — E7800 Pure hypercholesterolemia, unspecified: Secondary | ICD-10-CM | POA: Diagnosis present

## 2022-02-10 DIAGNOSIS — I5033 Acute on chronic diastolic (congestive) heart failure: Secondary | ICD-10-CM | POA: Diagnosis present

## 2022-02-10 DIAGNOSIS — Z6841 Body Mass Index (BMI) 40.0 and over, adult: Secondary | ICD-10-CM | POA: Diagnosis not present

## 2022-02-10 DIAGNOSIS — I110 Hypertensive heart disease with heart failure: Secondary | ICD-10-CM | POA: Diagnosis present

## 2022-02-10 LAB — TSH: TSH: 1.624 u[IU]/mL (ref 0.350–4.500)

## 2022-02-10 LAB — HIV ANTIBODY (ROUTINE TESTING W REFLEX): HIV Screen 4th Generation wRfx: NONREACTIVE

## 2022-02-10 LAB — GLUCOSE, CAPILLARY
Glucose-Capillary: 190 mg/dL — ABNORMAL HIGH (ref 70–99)
Glucose-Capillary: 185 mg/dL — ABNORMAL HIGH (ref 70–99)
Glucose-Capillary: 146 mg/dL — ABNORMAL HIGH (ref 70–99)
Glucose-Capillary: 177 mg/dL — ABNORMAL HIGH (ref 70–99)

## 2022-02-10 LAB — LIPID PANEL
Cholesterol: 91 mg/dL (ref 0–200)
HDL: 19 mg/dL — ABNORMAL LOW (ref >40)
LDL Cholesterol: 32 mg/dL (ref 0–99)
Total CHOL/HDL Ratio: 4.8 RATIO
Triglycerides: 201 mg/dL — ABNORMAL HIGH (ref <150)
VLDL: 40 mg/dL (ref 0–40)

## 2022-02-10 LAB — MAGNESIUM: Magnesium: 1.9 mg/dL (ref 1.7–2.4)

## 2022-02-10 LAB — CBC
HCT: 41.6 % (ref 36.0–46.0)
Hemoglobin: 12.8 g/dL (ref 12.0–15.0)
MCH: 25 pg — ABNORMAL LOW (ref 26.0–34.0)
MCHC: 30.8 g/dL (ref 30.0–36.0)
MCV: 81.3 fL (ref 80.0–100.0)
Platelets: 274 10*3/uL (ref 150–400)
RBC: 5.12 MIL/uL — ABNORMAL HIGH (ref 3.87–5.11)
RDW: 17.5 % — ABNORMAL HIGH (ref 11.5–15.5)
WBC: 9.9 10*3/uL (ref 4.0–10.5)
nRBC: 0.4 % — ABNORMAL HIGH (ref 0.0–0.2)

## 2022-02-10 LAB — COMPREHENSIVE METABOLIC PANEL
ALT: 14 U/L (ref 0–44)
AST: 20 U/L (ref 15–41)
Albumin: 2.7 g/dL — ABNORMAL LOW (ref 3.5–5.0)
Alkaline Phosphatase: 57 U/L (ref 38–126)
Anion gap: 8 (ref 5–15)
BUN: 6 mg/dL — ABNORMAL LOW (ref 8–23)
CO2: 29 mmol/L (ref 22–32)
Calcium: 9 mg/dL (ref 8.9–10.3)
Chloride: 102 mmol/L (ref 98–111)
Creatinine, Ser: 0.85 mg/dL (ref 0.44–1.00)
GFR, Estimated: >60 mL/min (ref >60)
Glucose, Bld: 198 mg/dL — ABNORMAL HIGH (ref 70–99)
Potassium: 3.6 mmol/L (ref 3.5–5.1)
Sodium: 139 mmol/L (ref 135–145)
Total Bilirubin: 0.6 mg/dL (ref 0.3–1.2)
Total Protein: 6.8 g/dL (ref 6.5–8.1)

## 2022-02-10 LAB — HEMOGLOBIN A1C
Hgb A1c MFr Bld: 8.5 % — ABNORMAL HIGH (ref 4.8–5.6)
Mean Plasma Glucose: 197.25 mg/dL

## 2022-02-10 LAB — ECHOCARDIOGRAM COMPLETE
Area-P 1/2: 2.93 cm2
Height: 65 in
S' Lateral: 1.8 cm
Weight: 4176 oz

## 2022-02-10 MED ORDER — LIVING WELL WITH DIABETES BOOK
Freq: Once | Status: AC
Start: 2022-02-10 — End: 2022-02-10
  Filled 2022-02-10: qty 1

## 2022-02-10 MED ORDER — UMECLIDINIUM BROMIDE 62.5 MCG/ACT IN AEPB
1.0000 | INHALATION_SPRAY | Freq: Every day | RESPIRATORY_TRACT | Status: DC
  Administered 2022-02-10 – 2022-02-16 (×7): 1 via RESPIRATORY_TRACT
  Filled 2022-02-10 (×2): qty 7

## 2022-02-10 NOTE — Inpatient Diabetes Management (Signed)
Inpatient Diabetes Program Recommendations  AACE/ADA: New Consensus Statement on Inpatient Glycemic Control (2015)  Target Ranges:  Prepandial:   less than 140 mg/dL      Peak postprandial:   less than 180 mg/dL (1-2 hours)      Critically ill patients:  140 - 180 mg/dL   Lab Results  Component Value Date   GLUCAP 185 (H) 02/10/2022   HGBA1C 8.5 (H) 02/10/2022    Review of Glycemic Control  Diabetes history: DM2 Outpatient Diabetes medications: Jardiance 10 mg qd, Januvia 100 mg qd Current orders for Inpatient glycemic control: Novolog 0-9 units tid , 0-5 units hs correction  Inpatient Diabetes Program Recommendations:   Spoke with patient and reviewed A1c of 8.5 (average blood glucose 197).  Patient reported to PCP one month ago concerned about urinary tract infection while being on jardiance, but no change in prescription @ this time.  Patient has been drinking regular gingerale and eating more sweets than usual but willing to change to sugar free drinks and limit sweets. Patient questioned why she is receiving insulin and explained correction scale in the hospital while she is in the hospital. If patient will need insulin, she will receive insulin instruction prior to discharge home. Sent message to RN explaining patient is willing to receive insulin correction.  Thank you, Nani Gasser. Jolanda Mccann, RN, MSN, CDE  Diabetes Coordinator Inpatient Glycemic Control Team Team Pager [phone] (8am-5pm) 02/10/2022 12:51 PM

## 2022-02-10 NOTE — Progress Notes (Signed)
PROGRESS NOTE  Joy Becker  ZOX:[id number] DOB: Nov 07, 1950 DOA: 02/09/2022 PCP: Shon Baton, MD   Brief Narrative:  Patient is a 71 year old female with history of morbid obesity, COPD not on oxygen, hypothyroidism, diabetes type 2, hypertension, hyperlipidemia who presented from home with complaints of progressive shortness of breath for 2 days, lower extremity edema, cough.  Patient was seen at her PCPs office when she was noted to be saturating 79% on room air.  She was then put on 3 L of oxygen and was sent to the emergency department.  On presentation,she was needing 2 L of oxygen per minute.  Lab work showed potassium of 3.4.  She was noticed to have edema of lower extremities.  Chest x-ray showed interstitial prominence.  Patient was admitted for the management of COPD exacerbation.  Assessment & Plan:  Principal Problem:   Acute respiratory failure with hypoxia (HCC) Active Problems:   High cholesterol   Diabetes mellitus without complication (HCC)   Hypertension   Hypothyroidism   COPD (chronic obstructive pulmonary disease) (HCC)   Shortness of breath   Hypokalemia    Acute hypoxic respiratory failure: Secondary to COPD exacerbation with possibility of concurrent dCHF.  Presented with cough, shortness of breath, lower extremity edema, requiring oxygen.  We will try to wean the oxygen.  She is not on oxygen at home.  If she qualifies, will plan to send her home with oxygen. There is also suspicion for concurrent OSA/OSH.  Patient might benefit with sleep study as an outpatient.  Acute COPD exacerbation: Previous smoker.  Does not use oxygen at home.  Does not follow with pulmonology.  Continue bronchodilators.  Not wheezing so not started on steroids.  Takes albuterol, Incruse Ellipta at home  Exacerbation of diastolic CHF: Previous echo done in 2020 showed EF of 60 to 65%, impaired relaxation.  New echo has been ordered.  She presented with lower extremity edema.  Started on  IV Lasix 40 mg daily.  We will continue that for now.  Continue monitor daily weight, input, output.  BNP normal presentation. Chest imagings showed interstitial prominence, features of volume overload/pulmonary edema.  Lungs are clear on auscultation today.  Hypertension: On metoprolol, losartan at home.  Continued  Diabetes type II: Monitor blood sugars.  On Jardiance, Januvia at home.  Currently on sliding scale insulin.A1c of 8.5  Hypothyroidism: Continue Synthyroid  Hypokalemia: Supplemented and corrected.  Deconditioning: We will request for PT evaluation.  Obesity: BMI of 43.4       DVT prophylaxis:enoxaparin (LOVENOX) injection 40 mg Start: 02/09/22 2000     Code Status: Full Code  Family Communication: None at the bedside  Patient status:obs  Patient is from :Home  Anticipated discharge WJ:XBJY  Estimated DC date:1-2 days   Consultants: None  Procedures: None  Antimicrobials:  Anti-infectives (From admission, onward)    None       Subjective:  Patient seen and examined at the bedside this morning.  Hemodynamically stable during my evaluation.  Sitting on the bed.  Appears comfortable without any respiratory distress.  Speaking in full sentences.  Still on 2 to 3 L of oxygen per minute.  Lower extremity edema has improved but still there to some extent.  Lungs are clear to auscultation.  Objective: Vitals:   02/10/22 0645 02/10/22 0715 02/10/22 0759 02/10/22 0916  BP: 116/64 119/73 123/66   Pulse: 80 74 76   Resp: 19 (!) 21 20   Temp:  98.4 F (36.9 C) 97.6 F (  36.4 C)   TempSrc:  Axillary Oral   SpO2: 94% 94% (!) 85% 92%  Weight:  118.4 kg    Height:  '5\' 5"'$  (1.651 m)      Intake/Output Summary (Last 24 hours) at 02/10/2022 1031 Last data filed at 02/09/2022 1909 Gross per 24 hour  Intake 81.32 ml  Output --  Net 81.32 ml   Filed Weights   02/09/22 1613 02/10/22 0715  Weight: 118.4 kg 118.4 kg    Examination:  General exam: Overall  comfortable, not in distress, morbidly obese, pleasant female HEENT: PERRL Respiratory system:  no wheezes or crackles  Cardiovascular system: S1 & S2 heard, RRR.  Gastrointestinal system: Abdomen is nondistended, soft and nontender. Central nervous system: Alert and oriented Extremities: Trace bilateral lower extremity edema, no clubbing ,no cyanosis Skin: No rashes, no ulcers,no icterus, skin   Data Reviewed: I have personally reviewed following labs and imaging studies  CBC: Recent Labs  Lab 02/09/22 1151 02/10/22 0325  WBC 9.8 9.9  HGB 12.9 12.8  HCT 39.5 41.6  MCV 78.7* 81.3  PLT 277 151   Basic Metabolic Panel: Recent Labs  Lab 02/09/22 1151 02/10/22 0325  NA 140 139  K 3.4* 3.6  CL 108 102  CO2 26 29  GLUCOSE 171* 198*  BUN 6* 6*  CREATININE 0.73 0.85  CALCIUM 9.4 9.0  MG  --  1.9     No results found for this or any previous visit (from the past 240 hour(s)).   Radiology Studies: CT Angio Chest PE W and/or Wo Contrast  Result Date: 02/09/2022 CLINICAL DATA:  Positive D-dimer.  Shortness of breath. EXAM: CT ANGIOGRAPHY CHEST WITH CONTRAST TECHNIQUE: Multidetector CT imaging of the chest was performed using the standard protocol during bolus administration of intravenous contrast. Multiplanar CT image reconstructions and MIPs were obtained to evaluate the vascular anatomy. RADIATION DOSE REDUCTION: This exam was performed according to the departmental dose-optimization program which includes automated exposure control, adjustment of the mA and/or kV according to patient size and/or use of iterative reconstruction technique. CONTRAST:  119m OMNIPAQUE IOHEXOL 350 MG/ML SOLN COMPARISON:  None Available. FINDINGS: Cardiovascular: The heart is enlarged. Aorta is normal in size. There are atherosclerotic calcifications of the aorta. There is no pericardial effusion. There is adequate opacification of the pulmonary arteries to the segmental level. There is no evidence for  pulmonary embolism. The main pulmonary artery is enlarged which can be seen with pulmonary artery hypertension. Mediastinum/Nodes: There are nonenlarged mediastinal and hilar lymph nodes bilaterally. There are mildly enlarged left hilar lymph nodes measuring 11 mm short axis. There are mildly enlarged right hilar lymph nodes measuring 14 mm short axis. The visualized esophagus and thyroid gland are within normal limits. There is a small hiatal hernia. Lungs/Pleura: Diffuse patchy ground-glass opacities are seen throughout both lungs. There are some subpleural areas of linear opacities in both lung bases. There is no pleural effusion or pneumothorax. Trachea and central airways are patent. Upper Abdomen: No acute abnormality. Musculoskeletal: No chest wall abnormality. No acute or significant osseous findings. Review of the MIP images confirms the above findings. IMPRESSION: 1. No evidence for pulmonary embolism. 2. Cardiomegaly. 3. Enlargement of the main pulmonary artery which can be seen with pulmonary artery hypertension. 4. Diffuse patchy ground-glass opacities may represent pulmonary edema or infection. 5. Subpleural opacities in the lung bases may be related to chronic scarring. 6. Mild bilateral hilar lymphadenopathy. Aortic Atherosclerosis (ICD10-I70.0). Electronically Signed   By: ARonney Asters  M.D.   On: 02/09/2022 18:22   VAS [place] LOWER EXTREMITY VENOUS (DVT) (7a-7p)  Result Date: 02/09/2022  Lower Venous DVT Study Patient Name:  [facility]  Date of Exam:   02/09/2022 Medical Rec #: [id number]      Accession #:    [id number] Date of Birth: 07-09-1950      Patient Gender: F Patient Age:   70 years Exam Location:  [facility] Procedure:      VAS [place] LOWER EXTREMITY VENOUS (DVT) Referring Phys: JON KNAPP --------------------------------------------------------------------------------  Indications: Swelling.  Limitations: Poor ultrasound/tissue interface and body habitus. Comparison Study: Previous  exam on 01/20/20 was negative for DVT. Performing Technologist: Rogelia Rohrer RVT, RDMS  Examination Guidelines: A complete evaluation includes B-mode imaging, spectral Doppler, color Doppler, and power Doppler as needed of all accessible portions of each vessel. Bilateral testing is considered an integral part of a complete examination. Limited examinations for reoccurring indications may be performed as noted. The reflux portion of the exam is performed with the patient in reverse Trendelenburg.  +-----+---------------+---------+-----------+----------+--------------+ RIGHTCompressibilityPhasicitySpontaneityPropertiesThrombus Aging +-----+---------------+---------+-----------+----------+--------------+ CFV  Full           Yes      Yes                                 +-----+---------------+---------+-----------+----------+--------------+   +---------+---------------+---------+-----------+----------+-------------------+ LEFT     CompressibilityPhasicitySpontaneityPropertiesThrombus Aging      +---------+---------------+---------+-----------+----------+-------------------+ CFV      Full           Yes      Yes                                      +---------+---------------+---------+-----------+----------+-------------------+ SFJ      Full                                                             +---------+---------------+---------+-----------+----------+-------------------+ FV Prox  Full           Yes      Yes                                      +---------+---------------+---------+-----------+----------+-------------------+ FV Mid   Full           Yes      Yes                                      +---------+---------------+---------+-----------+----------+-------------------+ FV DistalFull           Yes      Yes                                      +---------+---------------+---------+-----------+----------+-------------------+ PFV      Full                                                              +---------+---------------+---------+-----------+----------+-------------------+  POP      Full           Yes      Yes                                      +---------+---------------+---------+-----------+----------+-------------------+ PTV      Full                                                             +---------+---------------+---------+-----------+----------+-------------------+ PERO     Full                                         Not well visualized +---------+---------------+---------+-----------+----------+-------------------+    Summary: RIGHT: - No evidence of common femoral vein obstruction.  LEFT: - There is no evidence of deep vein thrombosis in the lower extremity.  - No cystic structure found in the popliteal fossa.  *See table(s) above for measurements and observations. Electronically signed by Monica Martinez MD on 02/09/2022 at 3:16:23 PM.    Final    DG Chest 2 View  Result Date: 02/09/2022 CLINICAL DATA:  shortness of breath EXAM: CHEST - 2 VIEW COMPARISON:  None Available. FINDINGS: Interstitial prominence. Top-normal heart size. No significant pleural effusion. No pneumothorax. No acute osseous abnormality. IMPRESSION: Age-indeterminate interstitial prominence may be chronic or reflect edema. Electronically Signed   By: Macy Mis M.D.   On: 02/09/2022 12:17    Scheduled Meds:  aspirin EC  81 mg Oral Daily   enoxaparin (LOVENOX) injection  40 mg Subcutaneous Q24H   famotidine  20 mg Oral BID   furosemide  40 mg Intravenous Daily   insulin aspart  0-5 Units Subcutaneous QHS   insulin aspart  0-9 Units Subcutaneous TID WC   levothyroxine  100 mcg Oral QAC breakfast   linaclotide  72 mcg Oral QAC breakfast   losartan  50 mg Oral Daily   metoprolol succinate  50 mg Oral Daily   montelukast  10 mg Oral QHS   potassium chloride  40 mEq Oral Daily   rosuvastatin  10 mg Oral Daily   sodium chloride flush  3  mL Intravenous Q12H   umeclidinium bromide  1 puff Inhalation Daily   [START ON 02/11/2022] Vitamin D (Ergocalciferol)  50,000 Units Oral Q Sat   Continuous Infusions:  sodium chloride       LOS: 0 days   Shelly Coss, MD [facility] P8/18/2023, 10:31 AM

## 2022-02-10 NOTE — Evaluation (Signed)
Physical Therapy Evaluation Patient Details Name: Joy Becker MRN: [id number] DOB: 23-Oct-1950 Today's Date: 02/10/2022  History of Present Illness  71 y/o female presented to ED on 02/09/22 for SOB x 3 days and found to be hypoxic at MD office. Admitted for acute COPD exacerbation and acute hypoxic respiratory failure. PMH: T2DM, COPD, HTN  Clinical Impression  Patient admitted with the above. PTA, patient lives alone in a senior living apartment with an Media planner. Patient ambulates with rollator at home and [facility] for attending doctor's appointments at baseline. Patient presents with weakness and decreased activity tolerance. Patient requiring 3L O2 [place] to maintain spO2 >90% at rest and with short mobility. Patient overall at supervision level with use of SPC. Patient will benefit from skilled PT services during acute stay to address listed deficits. Recommend HHPT at discharge to maximize functional independence and endurance at home.        Recommendations for follow up therapy are one component of a multi-disciplinary discharge planning process, led by the attending physician.  Recommendations may be updated based on patient status, additional functional criteria and insurance authorization.  Follow Up Recommendations Home health PT      Assistance Recommended at Discharge Intermittent Supervision/Assistance  Patient can return home with the following       Equipment Recommendations None recommended by PT  Recommendations for Other Services  OT consult    Functional Status Assessment Patient has had a recent decline in their functional status and demonstrates the ability to make significant improvements in function in a reasonable and predictable amount of time.     Precautions / Restrictions Precautions Precautions: Fall Precaution Comments: watch O2 Restrictions Weight Bearing Restrictions: No      Mobility  Bed Mobility Overal bed mobility: Modified Independent                   Transfers Overall transfer level: Needs assistance Equipment used: Straight cane Transfers: Sit to/from Stand Sit to Stand: Supervision                Ambulation/Gait Ambulation/Gait assistance: Supervision Gait Distance (Feet): 30 Feet Assistive device: Straight cane Gait Pattern/deviations: Step-through pattern, Decreased stride length Gait velocity: decreased     General Gait Details: supervision for safety  Stairs            Wheelchair Mobility    Modified Rankin (Stroke Patients Only)       Balance Overall balance assessment: Mild deficits observed, not formally tested                                           Pertinent Vitals/Pain Pain Assessment Pain Assessment: No/denies pain    Home Living Family/patient expects to be discharged to:: Private residence (senior living apartments) Living Arrangements: Alone Available Help at Discharge: Family Type of Home: Apartment Home Access: Elevator       Home Layout: One level Home Equipment: Cane - single point;Rollator (4 wheels)      Prior Function Prior Level of Function : Independent/Modified Independent             Mobility Comments: uses cane to go to doctor's appointments and rollator at apartment complex. Does her own grocery shopping       Hand Dominance        Extremity/Trunk Assessment   Upper Extremity Assessment Upper Extremity Assessment: Generalized weakness  Lower Extremity Assessment Lower Extremity Assessment: Generalized weakness    Cervical / Trunk Assessment Cervical / Trunk Assessment: Kyphotic  Communication   Communication: No difficulties  Cognition Arousal/Alertness: Awake/alert Behavior During Therapy: WFL for tasks assessed/performed Overall Cognitive Status: Within Functional Limits for tasks assessed                                          General Comments General comments (skin integrity, edema,  etc.): on 3L O2 [place], spO2 95%. Removed with spO2 on RA drop to 87%. Increased to 3L with increase to 94%. Ambulated in room with spO2 91%    Exercises     Assessment/Plan    PT Assessment Patient needs continued PT services  PT Problem List Decreased mobility;Decreased balance;Decreased activity tolerance;Decreased strength       PT Treatment Interventions Gait training;DME instruction;Functional mobility training;Therapeutic activities;Therapeutic exercise;Balance training;Patient/family education    PT Goals (Current goals can be found in the Care Plan section)  Acute Rehab PT Goals Patient Stated Goal: to get better and go home PT Goal Formulation: With patient Time For Goal Achievement: 02/24/22 Potential to Achieve Goals: Good    Frequency Min 3X/week     Co-evaluation               AM-PAC PT "6 Clicks" Mobility  Outcome Measure Help needed turning from your back to your side while in a flat bed without using bedrails?: None Help needed moving from lying on your back to sitting on the side of a flat bed without using bedrails?: None Help needed moving to and from a bed to a chair (including a wheelchair)?: A Little Help needed standing up from a chair using your arms (e.g., wheelchair or bedside chair)?: A Little Help needed to walk in hospital room?: A Little Help needed climbing 3-5 steps with a railing? : A Little 6 Click Score: 20    End of Session Equipment Utilized During Treatment: Oxygen Activity Tolerance: Patient tolerated treatment well Patient left: in chair;with call bell/phone within reach Nurse Communication: Mobility status PT Visit Diagnosis: Muscle weakness (generalized) (M62.81);Unsteadiness on feet (R26.81)    Time: 1962-2297 PT Time Calculation (min) (ACUTE ONLY): 26 min   Charges:   PT Evaluation $PT Eval Low Complexity: 1 Low PT Treatments $Therapeutic Activity: 8-22 mins        Lenon Kuennen A. Gilford Rile PT, DPT Acute Rehabilitation  Services Office [phone]   Linna Hoff 02/10/2022, 5:12 PM

## 2022-02-10 NOTE — TOC Initial Note (Signed)
Transition of Care [facility]) - Initial/Assessment Note    Patient Details  Name: Joy Becker MRN: [id number] Date of Birth: 1950/11/14  [facility]) CM/SW Contact:    Marilu Favre, RN Phone Number: 02/10/2022, 10:35 AM  Clinical Narrative:                 Spoke to patient at bedside.   Confirmed face sheet information.   Patient's new PCP is Cammie Fulp , she has been to office saw Dr Assunta Found but has been assigned to [facility] . Updated face sheet.   Patient [facility] at [facility]. Daughter provides assistance when needed. Patient has transportation to appointments. She has her "routine medications" filled through [employer] and delivered to her daughters address. Any "emergency or new prescriptions " she has filled at [facility] on [address]. Her daughter brings medications to her.   Patient has walker , cane and shower chair at home. Patient normally independent at home. Patient does not have home oxygen   Expected Discharge Plan: Home/Self Care Barriers to Discharge: Continued Medical Work up   Patient Goals and CMS Choice Patient states their goals for this hospitalization and ongoing recovery are:: to return to home      Expected Discharge Plan and Services Expected Discharge Plan: Home/Self Care   Discharge Planning Services: CM Consult   Living arrangements for the past 2 months: Apartment                 DME Arranged: N/A         HH Arranged: NA          Prior Living Arrangements/Services Living arrangements for the past 2 months: Apartment Lives with:: Self Patient language and need for interpreter reviewed:: Yes        Need for Family Participation in Patient Care: Yes (Comment) Care giver support system in place?: Yes (comment) Current home services: DME Criminal Activity/Legal Involvement Pertinent to Current Situation/Hospitalization: No - Comment as needed  Activities of Daily Living      Permission Sought/Granted   Permission granted  to share information with : No              Emotional Assessment Appearance:: Appears stated age Attitude/Demeanor/Rapport: Engaged Affect (typically observed): Accepting Orientation: : Oriented to Self, Oriented to Place, Oriented to  Time, Oriented to Situation Alcohol / Substance Use: Not Applicable Psych Involvement: No (comment)  Admission diagnosis:  Shortness of breath [R06.02] Acute pulmonary edema (HCC) [J81.0] Hypoxia [R09.02] Pulmonary hypertension (HCC) [I27.20] Patient Active Problem List   Diagnosis Date Noted   Diabetes mellitus without complication ([place])    Hypertension    Hypothyroidism    COPD (chronic obstructive pulmonary disease) ([place])    Shortness of breath    Acute respiratory failure with hypoxia ([place])    Hypokalemia    High cholesterol    Vertigo    PCP:  Shon Baton, MD Pharmacy:   [id number] [place] [place] [id number]: [phone]: [phone]    Social Determinants of Health (SDOH) Interventions    Readmission Risk Interventions     No data to display

## 2022-02-10 NOTE — Progress Notes (Signed)
PHARMACIST - PHYSICIAN COMMUNICATION  CONCERNING: P&T Medication Policy Regarding Oral Bisphosphonates  RECOMMENDATION: Your order for alendronate (Fosamax), ibandronate (Boniva), or risedronate (Actonel) has been discontinued at this time.  If the patient's post-hospital medical condition warrants safe use of this class of drugs, please resume the pre-hospital regimen upon discharge.  DESCRIPTION:  Alendronate (Fosamax), ibandronate (Boniva), and risedronate (Actonel) can cause severe esophageal erosions in patients who are unable to remain upright at least 30 minutes after taking this medication.   Since brief interruptions in therapy are thought to have minimal impact on bone mineral density, the [place] has established that bisphosphonate orders should be routinely discontinued during hospitalization.   To override this safety policy and permit administration of Boniva, Fosamax, or Actonel in the hospital, prescribers must write "DO NOT HOLD" in the comments section when placing the order for this class of medications.  Sherlon Handing, PharmD, BCPS Please see amion for complete clinical pharmacist phone list 02/10/2022 4:34 AM

## 2022-02-10 NOTE — ED Notes (Signed)
Full linen change provided at this time patient reports no complaints. Patient has voided 1000 mL of urine since the administration of lasix IV

## 2022-02-10 NOTE — ED Notes (Signed)
ED TO INPATIENT HANDOFF REPORT   S Name/Age/Gender Joy Becker 71 y.o. female Room/Bed: 041C/041C  Code Status   Code Status: Full Code  Home/SNF/Other Home Patient oriented to: self, place, time, and situation Is this baseline? Yes   Triage Complete: Triage complete  Chief Complaint Shortness of breath [R06.02]  Triage Note Patient from [place] office w/ shortness of breath for 3 days. Patient experiences [facility] intermittently with exertion and at rest. Patient does not normally wear O2 but at MD office was found to be hypoxic at 79% and placed on 3 L of O2 improved to 95%. VS: 124/62, HR 80, Res 18    Allergies Allergies  Allergen Reactions   Ace Inhibitors Other (See Comments)    cough   Azithromycin Other (See Comments)    Unaware     Level of Care/Admitting Diagnosis ED Disposition     ED Disposition  Admit   Condition  --   Comment  Hospital Area: [facility] [[id number]]  Level of Care: Telemetry Medical [104]  May place patient in observation at [facility] or [place] if equivalent level of care is available:: No  Covid Evaluation: Asymptomatic - no recent exposure (last 10 days) testing not required  Diagnosis: Shortness of breath [786.05.ICD-9-CM]  Admitting Physician: Flora Lipps [[id number]]  Attending Physician: Flora Lipps [[id number]]          B Medical/Surgery History Past Medical History:  Diagnosis Date   COPD (chronic obstructive pulmonary disease) ([place])    Diabetes mellitus without complication ([place])    High cholesterol    Hypertension    Hypothyroidism    Vertigo    History reviewed. No pertinent surgical history.   A IV Location/Drains/Wounds Patient Lines/Drains/Airways Status     Active Line/Drains/Airways     Name Placement date Placement time Site Days   Peripheral IV 02/09/22 20 G Right Antecubital 02/09/22  1525  Antecubital  1            Intake/Output Last 24 hours  Intake/Output  Summary (Last 24 hours) at 02/10/2022 0422 Last data filed at 02/09/2022 1909 Gross per 24 hour  Intake 81.32 ml  Output --  Net 81.32 ml    Labs/Imaging Results for orders placed or performed during the hospital encounter of 02/09/22 (from the past 48 hour(s))  Basic metabolic panel     Status: Abnormal   Collection Time: 02/09/22 11:51 AM  Result Value Ref Range   Sodium 140 135 - 145 mmol/L   Potassium 3.4 (L) 3.5 - 5.1 mmol/L   Chloride 108 98 - 111 mmol/L   CO2 26 22 - 32 mmol/L   Glucose, Bld 171 (H) 70 - 99 mg/dL    Comment: Glucose reference range applies only to samples taken after fasting for at least 8 hours.   BUN 6 (L) 8 - 23 mg/dL   Creatinine, Ser 0.73 0.44 - 1.00 mg/dL   Calcium 9.4 8.9 - 10.3 mg/dL   GFR, Estimated >60 >60 mL/min    Comment: (NOTE) Calculated using the CKD-EPI Creatinine Equation (2021)    Anion gap 6 5 - 15    Comment: Performed at [facility] [address], [place] [address]  CBC     Status: Abnormal   Collection Time: 02/09/22 11:51 AM  Result Value Ref Range   WBC 9.8 4.0 - 10.5 K/uL   RBC 5.02 3.87 - 5.11 MIL/uL   Hemoglobin 12.9 12.0 - 15.0 g/dL  HCT 39.5 36.0 - 46.0 %   MCV 78.7 (L) 80.0 - 100.0 fL   MCH 25.7 (L) 26.0 - 34.0 pg   MCHC 32.7 30.0 - 36.0 g/dL   RDW 17.4 (H) 11.5 - 15.5 %   Platelets 277 150 - 400 K/uL   nRBC 0.3 (H) 0.0 - 0.2 %    Comment: Performed at [facility] [address] [place], [place] [address]  Troponin I (High Sensitivity)     Status: None   Collection Time: 02/09/22 11:51 AM  Result Value Ref Range   Troponin I (High Sensitivity) 5 <18 ng/L    Comment: (NOTE) Elevated high sensitivity troponin I (hsTnI) values and significant  changes across serial measurements may suggest ACS but many other  chronic and acute conditions are known to elevate hsTnI results.  Refer to the "Links" section for chest pain algorithms and additional  guidance. Performed at [facility], [place] [address] [place], [place] [address]   Brain natriuretic peptide     Status: None   Collection Time: 02/09/22 11:51 AM  Result Value Ref Range   B Natriuretic Peptide 49.9 0.0 - 100.0 pg/mL    Comment: Performed at [facility] [address] [place], [place] [address]  D-dimer, quantitative     Status: Abnormal   Collection Time: 02/09/22  2:18 PM  Result Value Ref Range   D-Dimer, Quant 0.97 (H) 0.00 - 0.50 ug/mL-FEU    Comment: (NOTE) At the manufacturer cut-off value of 0.5 g/mL FEU, this assay has a negative predictive value of 95-100%.This assay is intended for use in conjunction with a clinical pretest probability (PTP) assessment model to exclude pulmonary embolism (PE) and deep venous thrombosis (DVT) in outpatients suspected of PE or DVT. Results should be correlated with clinical presentation. Performed at [facility], [place] [address] [place], [place] [address]   Troponin I (High Sensitivity)     Status: None   Collection Time: 02/09/22  2:18 PM  Result Value Ref Range   Troponin I (High Sensitivity) 5 <18 ng/L    Comment: (NOTE) Elevated high sensitivity troponin I (hsTnI) values and significant  changes across serial measurements may suggest ACS but many other  chronic and acute conditions are known to elevate hsTnI results.  Refer to the "Links" section for chest pain algorithms and additional  guidance. Performed at [facility], [place] [address], [place], [place] [address]   CBG monitoring, ED     Status: Abnormal   Collection Time: 02/09/22 10:18 PM  Result Value Ref Range   Glucose-Capillary 204 (H) 70 - 99 mg/dL    Comment: Glucose reference range applies only to samples taken after fasting for at least 8 hours.  CBC     Status: Abnormal   Collection Time: 02/10/22  3:25 AM  Result Value Ref Range   WBC 9.9 4.0 - 10.5 K/uL   RBC 5.12 (H) 3.87 - 5.11 MIL/uL   Hemoglobin 12.8 12.0 - 15.0 g/dL   HCT 41.6 36.0 - 46.0 %   MCV 81.3  80.0 - 100.0 fL   MCH 25.0 (L) 26.0 - 34.0 pg   MCHC 30.8 30.0 - 36.0 g/dL   RDW 17.5 (H) 11.5 - 15.5 %   Platelets 274 150 - 400 K/uL   nRBC 0.4 (H) 0.0 - 0.2 %    Comment: Performed at [facility] [address] [place],  [address]  Hemoglobin A1c     Status: Abnormal   Collection Time: 02/10/22  3:25 AM  Result Value Ref Range   Hgb A1c MFr Bld 8.5 (H) 4.8 - 5.6 %    Comment: (NOTE) Pre diabetes:          5.7%-6.4%  Diabetes:              >6.4%  Glycemic control for   <7.0% adults with diabetes    Mean Plasma Glucose 197.25 mg/dL    Comment: Performed at [facility] [address] [place], [place] [address]   CT Angio Chest PE W and/or Wo Contrast  Result Date: 02/09/2022 CLINICAL DATA:  Positive D-dimer.  Shortness of breath. EXAM: CT ANGIOGRAPHY CHEST WITH CONTRAST TECHNIQUE: Multidetector CT imaging of the chest was performed using the standard protocol during bolus administration of intravenous contrast. Multiplanar CT image reconstructions and MIPs were obtained to evaluate the vascular anatomy. RADIATION DOSE REDUCTION: This exam was performed according to the departmental dose-optimization program which includes automated exposure control, adjustment of the mA and/or kV according to patient size and/or use of iterative reconstruction technique. CONTRAST:  151m OMNIPAQUE IOHEXOL 350 MG/ML SOLN COMPARISON:  None Available. FINDINGS: Cardiovascular: The heart is enlarged. Aorta is normal in size. There are atherosclerotic calcifications of the aorta. There is no pericardial effusion. There is adequate opacification of the pulmonary arteries to the segmental level. There is no evidence for pulmonary embolism. The main pulmonary artery is enlarged which can be seen with pulmonary artery hypertension. Mediastinum/Nodes: There are nonenlarged mediastinal and hilar lymph nodes bilaterally. There are mildly enlarged left hilar lymph nodes measuring 11 mm short axis.  There are mildly enlarged right hilar lymph nodes measuring 14 mm short axis. The visualized esophagus and thyroid gland are within normal limits. There is a small hiatal hernia. Lungs/Pleura: Diffuse patchy ground-glass opacities are seen throughout both lungs. There are some subpleural areas of linear opacities in both lung bases. There is no pleural effusion or pneumothorax. Trachea and central airways are patent. Upper Abdomen: No acute abnormality. Musculoskeletal: No chest wall abnormality. No acute or significant osseous findings. Review of the MIP images confirms the above findings. IMPRESSION: 1. No evidence for pulmonary embolism. 2. Cardiomegaly. 3. Enlargement of the main pulmonary artery which can be seen with pulmonary artery hypertension. 4. Diffuse patchy ground-glass opacities may represent pulmonary edema or infection. 5. Subpleural opacities in the lung bases may be related to chronic scarring. 6. Mild bilateral hilar lymphadenopathy. Aortic Atherosclerosis (ICD10-I70.0). Electronically Signed   By: ARonney AstersM.D.   On: 02/09/2022 18:22   VAS [place]OWER EXTREMITY VENOUS (DVT) (7a-7p)  Result Date: 02/09/2022  Lower Venous DVT Study [facility] Date of Exam:   02/09/2022 Medical Rec #: [id number]     Accession #:    [id number] of Birth: 611-27-1952     Patient Gender: F Patient Age:   89years [facility]:      VAS [place]OWER EXTREMITY VENOUS (DVT) Referring Phys: JON KNAPP --------------------------------------------------------------------------------  Indications: Swelling.  Limitations: Poor ultrasound/tissue interface and body habitus. Comparison Study: Previous exam on 01/20/20 was negative for DVT. Performing Technologist: JRogelia RohrerRVT, RDMS  Examination Guidelines: A complete evaluation includes B-mode imaging, spectral Doppler, color Doppler, and power Doppler as needed of all accessible portions of each vessel. Bilateral testing  is considered an integral part of a complete examination. Limited examinations for  reoccurring indications may be performed as noted. The reflux portion of the exam is performed with the patient in reverse Trendelenburg.  +-----+---------------+---------+-----------+----------+--------------+ RIGHTCompressibilityPhasicitySpontaneityPropertiesThrombus Aging +-----+---------------+---------+-----------+----------+--------------+ CFV  Full           Yes      Yes                                 +-----+---------------+---------+-----------+----------+--------------+   +---------+---------------+---------+-----------+----------+-------------------+ LEFT     CompressibilityPhasicitySpontaneityPropertiesThrombus Aging      +---------+---------------+---------+-----------+----------+-------------------+ CFV      Full           Yes      Yes                                      +---------+---------------+---------+-----------+----------+-------------------+ SFJ      Full                                                             +---------+---------------+---------+-----------+----------+-------------------+ FV Prox  Full           Yes      Yes                                      +---------+---------------+---------+-----------+----------+-------------------+ FV Mid   Full           Yes      Yes                                      +---------+---------------+---------+-----------+----------+-------------------+ FV DistalFull           Yes      Yes                                      +---------+---------------+---------+-----------+----------+-------------------+ PFV      Full                                                             +---------+---------------+---------+-----------+----------+-------------------+ POP      Full           Yes      Yes                                       +---------+---------------+---------+-----------+----------+-------------------+ PTV      Full                                                             +---------+---------------+---------+-----------+----------+-------------------+  PERO     Full                                         Not well visualized +---------+---------------+---------+-----------+----------+-------------------+    Summary: RIGHT: - No evidence of common femoral vein obstruction.  LEFT: - There is no evidence of deep vein thrombosis in the lower extremity.  - No cystic structure found in the popliteal fossa.  *See table(s) above for measurements and observations. Electronically signed by Monica Martinez MD on 02/09/2022 at 3:16:23 PM.    Final    DG Chest 2 View  Result Date: 02/09/2022 CLINICAL DATA:  shortness of breath EXAM: CHEST - 2 VIEW COMPARISON:  None Available. FINDINGS: Interstitial prominence. Top-normal heart size. No significant pleural effusion. No pneumothorax. No acute osseous abnormality. IMPRESSION: Age-indeterminate interstitial prominence may be chronic or reflect edema. Electronically Signed   By: Macy Mis M.D.   On: 02/09/2022 12:17    Pending Labs Unresulted Labs (From admission, onward)     Start     Ordered   02/10/22 0500  Comprehensive metabolic panel  Tomorrow morning,   R        02/09/22 1909   02/10/22 0500  Magnesium  Tomorrow morning,   R        02/09/22 1909   02/10/22 0500  TSH  Tomorrow morning,   R        02/09/22 1937   02/10/22 0500  Lipid panel  Tomorrow morning,   R        02/09/22 1937   02/09/22 1905  HIV Antibody (routine testing w rflx)  (HIV Antibody (Routine testing w reflex) panel)  Once,   R        02/09/22 1909            Vitals/Pain Today's Vitals   02/10/22 0330 02/10/22 0345 02/10/22 0400 02/10/22 0415  BP: (!) 115/53 112/60 115/61 114/70  Pulse: 73 73 74 75  Resp: 20 (!) 25 (!) 22 19  Temp:      TempSrc:      SpO2: 91% 91% 91% 92%   Weight:      PainSc:        Isolation Precautions No active isolations  Medications Medications  aspirin EC tablet 81 mg (81 mg Oral Given 02/09/22 2211)  losartan (COZAAR) tablet 50 mg (50 mg Oral Given 02/09/22 2212)  metoprolol succinate (TOPROL-XL) 24 hr tablet 50 mg (50 mg Oral Given 02/09/22 2211)  rosuvastatin (CRESTOR) tablet 10 mg (10 mg Oral Given 02/09/22 2211)  alendronate (FOSAMAX) tablet 70 mg (has no administration in time range)  levothyroxine (SYNTHROID) tablet 100 mcg (has no administration in time range)  famotidine (PEPCID) tablet 20 mg (20 mg Oral Given 02/09/22 2211)  linaclotide (LINZESS) capsule 72 mcg (has no administration in time range)  Vitamin D (Ergocalciferol) (DRISDOL) 1.25 MG (50000 UNIT) capsule 50,000 Units (has no administration in time range)  umeclidinium bromide (INCRUSE ELLIPTA) 62.5 MCG/ACT 1 puff (1 puff Inhalation Not Given 02/09/22 2124)  montelukast (SINGULAIR) tablet 10 mg (10 mg Oral Given 02/09/22 2212)  ciclopirox (PENLAC) 8 % solution (0 mLs Topical Hold 02/10/22 0300)  Polyethyl Glycol-Propyl Glycol 0.4-0.3 % SOLN 1 drop (has no administration in time range)  ammonium lactate (LAC-HYDRIN) 12 % lotion 1 Application (has no administration in time range)  furosemide (LASIX) injection 40 mg (has no administration  in time range)  enoxaparin (LOVENOX) injection 40 mg (40 mg Subcutaneous Given 02/09/22 2215)  sodium chloride flush (NS) 0.9 % injection 3 mL (3 mLs Intravenous Given 02/09/22 2200)  sodium chloride flush (NS) 0.9 % injection 3 mL (has no administration in time range)  0.9 %  sodium chloride infusion (has no administration in time range)  acetaminophen (TYLENOL) tablet 650 mg (650 mg Oral Given 02/09/22 2211)    Or  acetaminophen (TYLENOL) suppository 650 mg ( Rectal See Alternative 02/09/22 2211)  polyethylene glycol (MIRALAX / GLYCOLAX) packet 17 g (has no administration in time range)  ondansetron (ZOFRAN) tablet 4 mg (has no  administration in time range)    Or  ondansetron (ZOFRAN) injection 4 mg (has no administration in time range)  ipratropium-albuterol (DUONEB) 0.5-2.5 (3) MG/3ML nebulizer solution 3 mL (has no administration in time range)  potassium chloride SA (KLOR-CON M) CR tablet 40 mEq (40 mEq Oral Given 02/09/22 2215)  insulin aspart (novoLOG) injection 0-9 Units (has no administration in time range)  insulin aspart (novoLOG) injection 0-5 Units (2 Units Subcutaneous Given 02/09/22 2219)  heparin bolus via infusion 5,500 Units (5,500 Units Intravenous Bolus from Bag 02/09/22 1627)  iohexol (OMNIPAQUE) 350 MG/ML injection 100 mL (100 mLs Intravenous Contrast Given 02/09/22 1811)  furosemide (LASIX) injection 40 mg (40 mg Intravenous Given 02/09/22 2216)    Mobility walks Low fall risk   Focused Assessments Cardiac Assessment Handoff:  Cardiac Rhythm: Normal sinus rhythm No results found for: "CKTOTAL", "CKMB", "CKMBINDEX", "TROPONINI" Lab Results  Component Value Date   DDIMER 0.97 (H) 02/09/2022   Does the Patient currently have chest pain? No   , Pulmonary Assessment Handoff:  Lung sounds: Bilateral Breath Sounds: Diminished O2 Device: Room Air O2 Flow Rate (L/min): 3 L/min    R Recommendations: See Admitting Provider Note

## 2022-02-10 NOTE — Progress Notes (Signed)
Patient arrived to [place] room 10 alert and oriented x4, pain level 4/10, oxygen [place] hooked up, bed in lowest position, call light in reach. Will continue to monitor

## 2022-02-11 DIAGNOSIS — E876 Hypokalemia: Secondary | ICD-10-CM | POA: Diagnosis not present

## 2022-02-11 DIAGNOSIS — I10 Essential (primary) hypertension: Secondary | ICD-10-CM | POA: Diagnosis not present

## 2022-02-11 DIAGNOSIS — J9601 Acute respiratory failure with hypoxia: Secondary | ICD-10-CM | POA: Diagnosis not present

## 2022-02-11 DIAGNOSIS — E119 Type 2 diabetes mellitus without complications: Secondary | ICD-10-CM | POA: Diagnosis not present

## 2022-02-11 LAB — BASIC METABOLIC PANEL
Anion gap: 4 — ABNORMAL LOW (ref 5–15)
BUN: 9 mg/dL (ref 8–23)
CO2: 31 mmol/L (ref 22–32)
Calcium: 9.3 mg/dL (ref 8.9–10.3)
Chloride: 104 mmol/L (ref 98–111)
Creatinine, Ser: 0.75 mg/dL (ref 0.44–1.00)
GFR, Estimated: >60 mL/min (ref >60)
Glucose, Bld: 206 mg/dL — ABNORMAL HIGH (ref 70–99)
Potassium: 3.6 mmol/L (ref 3.5–5.1)
Sodium: 139 mmol/L (ref 135–145)

## 2022-02-11 LAB — GLUCOSE, CAPILLARY
Glucose-Capillary: 215 mg/dL — ABNORMAL HIGH (ref 70–99)
Glucose-Capillary: 205 mg/dL — ABNORMAL HIGH (ref 70–99)
Glucose-Capillary: 202 mg/dL — ABNORMAL HIGH (ref 70–99)
Glucose-Capillary: 222 mg/dL — ABNORMAL HIGH (ref 70–99)

## 2022-02-11 NOTE — Evaluation (Signed)
Occupational Therapy Evaluation Patient Details Name: Joy Becker MRN: [id number] DOB: Feb 20, 1951 Today's Date: 02/11/2022   History of Present Illness 71 y/o female presented to ED on 02/09/22 for SOB x 3 days and found to be hypoxic at MD office. Admitted for acute COPD exacerbation and acute hypoxic respiratory failure. PMH: T2DM, COPD, HTN   Clinical Impression   Pt admitted for concerns listed above. PTA pt reported that she was independent with all ADL's and IADL's, including driving. At this time, pt appears near her baseline with ADL's and functional moblity, using a cane. She is requiring increased time and 3L of O2 due to SoB. No physical assist needed. Pt has no further skilled OT needs and acute OT will sign off.       Recommendations for follow up therapy are one component of a multi-disciplinary discharge planning process, led by the attending physician.  Recommendations may be updated based on patient status, additional functional criteria and insurance authorization.   Follow Up Recommendations  No OT follow up    Assistance Recommended at Discharge Set up Supervision/Assistance  Patient can return home with the following Assistance with cooking/housework    Functional Status Assessment  Patient has had a recent decline in their functional status and demonstrates the ability to make significant improvements in function in a reasonable and predictable amount of time.  Equipment Recommendations  None recommended by OT    Recommendations for Other Services       Precautions / Restrictions Precautions Precautions: Fall Precaution Comments: watch O2 Restrictions Weight Bearing Restrictions: No      Mobility Bed Mobility Overal bed mobility: Modified Independent                  Transfers Overall transfer level: Needs assistance Equipment used: Straight cane Transfers: Sit to/from Stand Sit to Stand: Supervision                  Balance  Overall balance assessment: Mild deficits observed, not formally tested                                         ADL either performed or assessed with clinical judgement   ADL Overall ADL's : At baseline;Modified independent                                             Vision Baseline Vision/History: 0 No visual deficits Ability to See in Adequate Light: 0 Adequate Patient Visual Report: No change from baseline Vision Assessment?: No apparent visual deficits     Perception     Praxis      Pertinent Vitals/Pain Pain Assessment Pain Assessment: No/denies pain     Hand Dominance Right   Extremity/Trunk Assessment Upper Extremity Assessment Upper Extremity Assessment: Generalized weakness   Lower Extremity Assessment Lower Extremity Assessment: Generalized weakness   Cervical / Trunk Assessment Cervical / Trunk Assessment: Kyphotic   Communication Communication Communication: No difficulties   Cognition Arousal/Alertness: Awake/alert Behavior During Therapy: WFL for tasks assessed/performed Overall Cognitive Status: Within Functional Limits for tasks assessed  General Comments  VSS on 3L    Exercises     Shoulder Instructions      Home Living Family/patient expects to be discharged to:: Private residence (senior living apartments) Living Arrangements: Alone Available Help at Discharge: Family Type of Home: Apartment Home Access: Elevator     Home Layout: One level     Bathroom Shower/Tub: Teacher, early years/pre: Standard Bathroom Accessibility: Yes How Accessible: Accessible via walker Home Equipment: [place] - single point;Rollator (4 wheels)          Prior Functioning/Environment Prior Level of Function : Independent/Modified Independent             Mobility Comments: uses cane to go to doctor's appointments and rollator at apartment complex.  Does her own grocery shopping          OT Problem List: Decreased strength;Decreased activity tolerance;Cardiopulmonary status limiting activity      OT Treatment/Interventions:      OT Goals(Current goals can be found in the care plan section) Acute Rehab OT Goals Patient Stated Goal: To get her endurance back OT Goal Formulation: With patient Time For Goal Achievement: 02/25/22 Potential to Achieve Goals: Good  OT Frequency:      Co-evaluation              AM-PAC OT "6 Clicks" Daily Activity     Outcome Measure Help from another person eating meals?: None Help from another person taking care of personal grooming?: None Help from another person toileting, which includes using toliet, bedpan, or urinal?: None Help from another person bathing (including washing, rinsing, drying)?: None Help from another person to put on and taking off regular upper body clothing?: None Help from another person to put on and taking off regular lower body clothing?: None 6 Click Score: 24   End of Session Equipment Utilized During Treatment: Oxygen Nurse Communication: Mobility status  Activity Tolerance: Patient tolerated treatment well Patient left: in chair;with call bell/phone within reach  OT Visit Diagnosis: Muscle weakness (generalized) (M62.81)                Time: 1145-1200 OT Time Calculation (min): 15 min Charges:  OT General Charges $OT Visit: 1 Visit OT Evaluation $OT Eval Low Complexity: 1 Low  Tammie Ellsworth H., OTR/L Acute Rehabilitation  Osamu Olguin Elane Damyon Mullane 02/11/2022, 2:23 PM

## 2022-02-11 NOTE — Progress Notes (Signed)
PROGRESS NOTE    Joy Becker  ASN:[id number] DOB: 07/20/1950 DOA: 02/09/2022 PCP: Antony Blackbird, MD   Brief Narrative: Joy Becker is a 71 y.o. female with a history of COPD, hypothyroidism, diabetes mellitus type 2, hypertension, hyperlipidemia. Patient presented secondary to shortness of breath and found to have a likely heart failure exacerbation. Lasix IV initiated.   Assessment and Plan:  Acute respiratory failure with hypoxia Present on admission with concern for COPD exacerbation. Patient with associated dyspnea on exertion. Currently stable on 3 but needing up to 6 L/min when ambulating. -Wean to room air as able  Acute on chronic diastolic heart failure Chest imaging significant for ground glass opacities possibly reflecting pulmonary edema vs infection. Patient started on Lasix IV for treatment. Transthoracic Echocardiogram (8/18) significant for LVEF of 60-65% and grade 1 diastolic dysfunction. Weight on admission of 118.4 kg (261 lbs). No in/out documented since admission. Weight of 116.8 kg (257.5 lbs) today. -Continue Lasix IV daily and BMP daily -Continue potassium supplementation -Strict in and out; daily weights  COPD Documented as acute but per history does not appear to have had wheezing and has not been treated with steroids. -Continue Incruse Ellipta and Duoneb  Primary hypertension -Continue losartan and metoprolol  Diabetes mellitus type 2 Hemoglobin A1C of 8.5%. Patient is on [place] as an outpatient. Patient started on SSI while inpatient. -Continue SSI  Hypothyroidism -Continue Synthroid  Hyperlipidemia -Continue Crestor  Hypokalemia -Potassium supplementation  Morbid obesity Body mass index is 42.85 kg/m.   DVT prophylaxis: Lovenox Code Status:   Code Status: Full Code Family Communication: None at bedside Disposition Plan: Discharge home once able to wean oxygen   Consultants:  None  Procedures:  Transthoracic  Echocardiogram (8/18)  Antimicrobials: None    Subjective: Patient reports dyspnea on exertion but is otherwise mostly fine at rest. Significant dyspnea if ambulating without oxygen.  Objective: BP 109/66 (BP Location: Left Arm)   Pulse 73   Temp 97.7 F (36.5 C) (Oral)   Resp 17   Ht '5\' 5"'$  (1.651 m)   Wt 116.8 kg   SpO2 95%   BMI 42.85 kg/m   Examination:  General exam: Appears calm and comfortable Respiratory system: Bibasilar rales but otherwise clear without wheezing. Respiratory effort normal. Cardiovascular system: S1 & S2 heard, RRR. No murmurs, rubs, gallops or clicks. Gastrointestinal system: Abdomen is obese, soft and non-tender. Normal bowel sounds heard. Central nervous system: Alert and oriented. No focal neurological deficits. Musculoskeletal: L>R BLE 2+ edema. No calf tenderness Skin: No cyanosis. No rashes Psychiatry: Judgement and insight appear normal. Mood & affect appropriate.    Data Reviewed: I have personally reviewed following labs and imaging studies  CBC Lab Results  Component Value Date   WBC 9.9 02/10/2022   RBC 5.12 (H) 02/10/2022   HGB 12.8 02/10/2022   HCT 41.6 02/10/2022   MCV 81.3 02/10/2022   MCH 25.0 (L) 02/10/2022   PLT 274 02/10/2022   MCHC 30.8 02/10/2022   RDW 17.5 (H) 02/10/2022   LYMPHSABS 0.7 10/01/2020   MONOABS 0.8 10/01/2020   EOSABS 0.4 10/01/2020   BASOSABS 0.0 19/37/9024     Last metabolic panel Lab Results  Component Value Date   NA 139 02/11/2022   K 3.6 02/11/2022   CL 104 02/11/2022   CO2 31 02/11/2022   BUN 9 02/11/2022   CREATININE 0.75 02/11/2022   GLUCOSE 206 (H) 02/11/2022   GFRNONAA >60 02/11/2022   CALCIUM 9.3 02/11/2022  PROT 6.8 02/10/2022   ALBUMIN 2.7 (L) 02/10/2022   BILITOT 0.6 02/10/2022   ALKPHOS 57 02/10/2022   AST 20 02/10/2022   ALT 14 02/10/2022   ANIONGAP 4 (L) 02/11/2022    GFR: Estimated Creatinine Clearance: 83.6 mL/min (by C-G formula based on SCr of 0.75  mg/dL).  No results found for this or any previous visit (from the past 240 hour(s)).    Radiology Studies: ECHOCARDIOGRAM COMPLETE  Result Date: 02/10/2022    ECHOCARDIOGRAM REPORT   Patient Name:   [facility] Date of Exam: 02/10/2022 Medical Rec #:  [id number]     Height:       65.0 in Accession #:    [id number]    Weight:       261.0 lb Date of Birth:  March 28, 1951     BSA:          2.215 m Patient Age:    50 years      BP:           123/66 mmHg Patient Gender: F             HR:           79 bpm. Exam Location:  Inpatient Procedure: 2D Echo, Cardiac Doppler and Color Doppler Indications:    Dyspnea  History:        Patient has prior history of Echocardiogram examinations, most                 recent 03/17/2019. COPD; Risk Factors:Hypertension and Diabetes.  Sonographer:    Eartha Inch Referring Phys: [id number] [place]  Sonographer Comments: Technically difficult study due to poor echo windows, suboptimal apical window, suboptimal parasternal window and suboptimal subcostal window. Image acquisition challenging due to patient body habitus, Image acquisition challenging due to respiratory motion and Image acquisition challenging due to COPD. IMPRESSIONS  1. Left ventricular ejection fraction, by estimation, is 60 to 65%. The left ventricle has normal function. The left ventricle has no regional wall motion abnormalities. There is mild left ventricular hypertrophy. Left ventricular diastolic parameters are consistent with Grade I diastolic dysfunction (impaired relaxation).  2. Right ventricular systolic function is normal. The right ventricular size is normal. Tricuspid regurgitation signal is inadequate for assessing PA pressure.  3. No evidence of mitral valve regurgitation.  4. Aortic valve regurgitation is not visualized.  5. The inferior vena cava is normal in size with greater than 50% respiratory variability, suggesting right atrial pressure of 3 mmHg. Comparison(s): No significant change from  prior study. FINDINGS  Left Ventricle: Left ventricular ejection fraction, by estimation, is 60 to 65%. The left ventricle has normal function. The left ventricle has no regional wall motion abnormalities. The left ventricular internal cavity size was normal in size. There is  mild left ventricular hypertrophy. Left ventricular diastolic parameters are consistent with Grade I diastolic dysfunction (impaired relaxation). Right Ventricle: The right ventricular size is normal. Right ventricular systolic function is normal. Tricuspid regurgitation signal is inadequate for assessing PA pressure. Left Atrium: Left atrial size was normal in size. Right Atrium: Right atrial size was normal in size. Pericardium: There is no evidence of pericardial effusion. Mitral Valve: No evidence of mitral valve regurgitation. Tricuspid Valve: The tricuspid valve is normal in structure. Tricuspid valve regurgitation is not demonstrated. Aortic Valve: Aortic valve regurgitation is not visualized. Pulmonic Valve: Pulmonic valve regurgitation is not visualized. Aorta: The aortic root and ascending aorta are structurally normal, with no evidence of dilitation. Venous: The  inferior vena cava is normal in size with greater than 50% respiratory variability, suggesting right atrial pressure of 3 mmHg.  LEFT VENTRICLE PLAX 2D LVIDd:         3.10 cm   Diastology LVIDs:         1.80 cm   LV e' medial:    9.14 cm/s LV PW:         1.10 cm   LV E/e' medial:  5.2 LV IVS:        1.10 cm   LV e' lateral:   7.83 cm/s LVOT diam:     2.20 cm   LV E/e' lateral: 6.1 LV SV:         78 LV SV Index:   35 LVOT Area:     3.80 cm  RIGHT VENTRICLE RV S prime:     11.20 cm/s TAPSE (M-mode): 1.8 cm LEFT ATRIUM             Index        RIGHT ATRIUM           Index LA diam:        3.00 cm 1.35 cm/m   RA Area:     15.40 cm LA Vol (A2C):   22.8 ml 10.29 ml/m  RA Volume:   35.10 ml  15.85 ml/m LA Vol (A4C):   22.0 ml 9.93 ml/m LA Biplane Vol: 24.3 ml 10.97 ml/m   AORTIC VALVE LVOT Vmax:   101.00 cm/s LVOT Vmean:  74.300 cm/s LVOT VTI:    0.204 m  AORTA Ao Root diam: 3.20 cm Ao Asc diam:  3.60 cm MITRAL VALVE MV Area (PHT): 2.93 cm    SHUNTS MV Decel Time: 259 msec    Systemic VTI:  0.20 m MV E velocity: 47.80 cm/s  Systemic Diam: 2.20 cm MV A velocity: 69.10 cm/s MV E/A ratio:  0.69 Mary Scientist, physiological signed by Phineas Inches Signature Date/Time: 02/10/2022/12:50:03 PM    Final    CT Angio Chest PE W and/or Wo Contrast  Result Date: 02/09/2022 CLINICAL DATA:  Positive D-dimer.  Shortness of breath. EXAM: CT ANGIOGRAPHY CHEST WITH CONTRAST TECHNIQUE: Multidetector CT imaging of the chest was performed using the standard protocol during bolus administration of intravenous contrast. Multiplanar CT image reconstructions and MIPs were obtained to evaluate the vascular anatomy. RADIATION DOSE REDUCTION: This exam was performed according to the departmental dose-optimization program which includes automated exposure control, adjustment of the mA and/or kV according to patient size and/or use of iterative reconstruction technique. CONTRAST:  153m OMNIPAQUE IOHEXOL 350 MG/ML SOLN COMPARISON:  None Available. FINDINGS: Cardiovascular: The heart is enlarged. Aorta is normal in size. There are atherosclerotic calcifications of the aorta. There is no pericardial effusion. There is adequate opacification of the pulmonary arteries to the segmental level. There is no evidence for pulmonary embolism. The main pulmonary artery is enlarged which can be seen with pulmonary artery hypertension. Mediastinum/Nodes: There are nonenlarged mediastinal and hilar lymph nodes bilaterally. There are mildly enlarged left hilar lymph nodes measuring 11 mm short axis. There are mildly enlarged right hilar lymph nodes measuring 14 mm short axis. The visualized esophagus and thyroid gland are within normal limits. There is a small hiatal hernia. Lungs/Pleura: Diffuse patchy ground-glass opacities  are seen throughout both lungs. There are some subpleural areas of linear opacities in both lung bases. There is no pleural effusion or pneumothorax. Trachea and central airways are patent. Upper Abdomen: No acute abnormality. Musculoskeletal: No  chest wall abnormality. No acute or significant osseous findings. Review of the MIP images confirms the above findings. IMPRESSION: 1. No evidence for pulmonary embolism. 2. Cardiomegaly. 3. Enlargement of the main pulmonary artery which can be seen with pulmonary artery hypertension. 4. Diffuse patchy ground-glass opacities may represent pulmonary edema or infection. 5. Subpleural opacities in the lung bases may be related to chronic scarring. 6. Mild bilateral hilar lymphadenopathy. Aortic Atherosclerosis (ICD10-I70.0). Electronically Signed   By: Ronney Asters M.D.   On: 02/09/2022 18:22      LOS: 1 day    Cordelia Poche, MD [facility] 02/11/2022, 4:26 PM   If 7PM-7AM, please contact night-coverage [email or web address]

## 2022-02-11 NOTE — Progress Notes (Signed)
Physical Therapy Treatment Patient Details Name: Joy Becker MRN: [id number] DOB: 1950-08-24 Today's Date: 02/11/2022   History of Present Illness 71 y/o female presented to ED on 02/09/22 for SOB x 3 days and found to be hypoxic at MD office. Admitted for acute COPD exacerbation and acute hypoxic respiratory failure. PMH: T2DM, COPD, HTN    PT Comments    Pt progressing steadily towards her physical therapy goals, exhibiting improved activity tolerance and ambulation distance. Pt ambulating 200 ft with a Rollator at a supervision level. Requires 6L O2 during mobility to maintain sats > 90%. Continues with decreased cardiopulmonary endurance. Will progress as tolerated.     Recommendations for follow up therapy are one component of a multi-disciplinary discharge planning process, led by the attending physician.  Recommendations may be updated based on patient status, additional functional criteria and insurance authorization.  Follow Up Recommendations  Home health PT     Assistance Recommended at Discharge Intermittent Supervision/Assistance  Patient can return home with the following Assistance with cooking/housework;Assist for transportation;Help with stairs or ramp for entrance   Equipment Recommendations  None recommended by PT    Recommendations for Other Services       Precautions / Restrictions Precautions Precautions: Fall Precaution Comments: watch O2 Restrictions Weight Bearing Restrictions: No     Mobility  Bed Mobility               General bed mobility comments: OOB in chair    Transfers Overall transfer level: Modified independent                      Ambulation/Gait Ambulation/Gait assistance: Supervision Gait Distance (Feet): 200 Feet Assistive device: Rollator (4 wheels) Gait Pattern/deviations: Step-through pattern, Decreased stride length Gait velocity: decreased     General Gait Details: increased trunk flexion, supervision for  safety, cues for activity pacing   Stairs             Wheelchair Mobility    Modified Rankin (Stroke Patients Only)       Balance Overall balance assessment: Mild deficits observed, not formally tested                                          Cognition Arousal/Alertness: Awake/alert Behavior During Therapy: WFL for tasks assessed/performed Overall Cognitive Status: Within Functional Limits for tasks assessed                                          Exercises      General Comments General comments (skin integrity, edema, etc.): VSS on 3L      Pertinent Vitals/Pain Pain Assessment Pain Assessment: No/denies pain    Home Living Family/patient expects to be discharged to:: Private residence (senior living apartments) Living Arrangements: Alone Available Help at Discharge: Family Type of Home: Apartment Home Access: Elevator       Home Layout: One level Home Equipment: Radio producer - single point;Rollator (4 wheels)      Prior Function            PT Goals (current goals can now be found in the care plan section) Acute Rehab PT Goals Patient Stated Goal: to get better and go home Potential to Achieve Goals: Good Progress towards PT goals: Progressing toward goals  Frequency    Min 3X/week      PT Plan Current plan remains appropriate    Co-evaluation              AM-PAC PT "6 Clicks" Mobility   Outcome Measure  Help needed turning from your back to your side while in a flat bed without using bedrails?: None Help needed moving from lying on your back to sitting on the side of a flat bed without using bedrails?: None Help needed moving to and from a bed to a chair (including a wheelchair)?: A Little Help needed standing up from a chair using your arms (e.g., wheelchair or bedside chair)?: A Little Help needed to walk in hospital room?: A Little Help needed climbing 3-5 steps with a railing? : A Little 6  Click Score: 20    End of Session Equipment Utilized During Treatment: Oxygen Activity Tolerance: Patient tolerated treatment well Patient left: in chair;with call bell/phone within reach Nurse Communication: Mobility status PT Visit Diagnosis: Muscle weakness (generalized) (M62.81);Unsteadiness on feet (R26.81)     Time: 8676-1950 PT Time Calculation (min) (ACUTE ONLY): 19 min  Charges:  $Therapeutic Activity: 8-22 mins                     Wyona Almas, PT, DPT Acute Rehabilitation Services Office [phone]    Deno Etienne 02/11/2022, 3:50 PM

## 2022-02-12 DIAGNOSIS — E119 Type 2 diabetes mellitus without complications: Secondary | ICD-10-CM | POA: Diagnosis not present

## 2022-02-12 DIAGNOSIS — E876 Hypokalemia: Secondary | ICD-10-CM | POA: Diagnosis not present

## 2022-02-12 DIAGNOSIS — J9601 Acute respiratory failure with hypoxia: Secondary | ICD-10-CM | POA: Diagnosis not present

## 2022-02-12 DIAGNOSIS — I10 Essential (primary) hypertension: Secondary | ICD-10-CM | POA: Diagnosis not present

## 2022-02-12 LAB — BASIC METABOLIC PANEL
Anion gap: 8 (ref 5–15)
BUN: 10 mg/dL (ref 8–23)
CO2: 24 mmol/L (ref 22–32)
Calcium: 9.3 mg/dL (ref 8.9–10.3)
Chloride: 103 mmol/L (ref 98–111)
Creatinine, Ser: 0.64 mg/dL (ref 0.44–1.00)
GFR, Estimated: >60 mL/min (ref >60)
Glucose, Bld: 195 mg/dL — ABNORMAL HIGH (ref 70–99)
Potassium: 4 mmol/L (ref 3.5–5.1)
Sodium: 135 mmol/L (ref 135–145)

## 2022-02-12 LAB — GLUCOSE, CAPILLARY
Glucose-Capillary: 182 mg/dL — ABNORMAL HIGH (ref 70–99)
Glucose-Capillary: 185 mg/dL — ABNORMAL HIGH (ref 70–99)
Glucose-Capillary: 186 mg/dL — ABNORMAL HIGH (ref 70–99)
Glucose-Capillary: 241 mg/dL — ABNORMAL HIGH (ref 70–99)
Glucose-Capillary: 232 mg/dL — ABNORMAL HIGH (ref 70–99)

## 2022-02-12 MED ORDER — FUROSEMIDE 10 MG/ML IJ SOLN
40.0000 mg | Freq: Two times a day (BID) | INTRAMUSCULAR | Status: DC
  Administered 2022-02-12 – 2022-02-13 (×2): 40 mg via INTRAVENOUS
  Filled 2022-02-12 (×2): qty 4

## 2022-02-12 NOTE — Progress Notes (Signed)
PROGRESS NOTE    Joy Becker  GQQ:[id number] DOB: 1951/03/05 DOA: 02/09/2022 PCP: Antony Blackbird, MD   Brief Narrative: Joy Becker is a 71 y.o. female with a history of COPD, hypothyroidism, diabetes mellitus type 2, hypertension, hyperlipidemia. Patient presented secondary to shortness of breath and found to have a likely heart failure exacerbation. Lasix IV initiated.   Assessment and Plan:  Acute respiratory failure with hypoxia Present on admission with concern for COPD exacerbation. Patient with associated dyspnea on exertion. Currently stable on 3 but needing up to 6 L/min when ambulating. -Wean to room air as able  Acute on chronic diastolic heart failure Chest imaging significant for ground glass opacities possibly reflecting pulmonary edema vs infection. Patient started on Lasix IV for treatment. Transthoracic Echocardiogram (8/18) significant for LVEF of 60-65% and grade 1 diastolic dysfunction. Weight on admission of 118.4 kg (261 lbs). UOP of 850 mL documented over the last 24 hours, but appear incomplete. Weight of 116.8 kg (257.5 lbs) 8/19; no weight available today. -Increase to Lasix 40 mg IV BID for today -Continue BMP daily -Continue potassium supplementation -Strict in and out; daily weights -If no improvement, repeat chest x-ray in AM; if unhelpful, possible repeat CT chest and/or pulmonology consult  COPD Documented as acute but per history does not appear to have had wheezing and has not been treated with steroids. -Continue Incruse Ellipta and Duoneb  Primary hypertension -Continue losartan and metoprolol  Diabetes mellitus type 2 Hemoglobin A1C of 8.5%. Patient is on [place] as an outpatient. Patient started on SSI while inpatient. -Continue SSI  Hypothyroidism -Continue Synthroid  Hyperlipidemia -Continue Crestor  Hypokalemia -Potassium supplementation  Morbid obesity Body mass index is 42.85 kg/m.   DVT prophylaxis:  Lovenox Code Status:   Code Status: Full Code Family Communication: None at bedside Disposition Plan: Discharge home once able to wean oxygen   Consultants:  None  Procedures:  Transthoracic Echocardiogram (8/18)  Antimicrobials: None    Subjective: Patient reports continued dyspnea on exertion. No chest pain. No cough.   Objective: BP 121/67 (BP Location: Left Arm)   Pulse 82   Temp 98.2 F (36.8 C) (Oral)   Resp 17   Ht '5\' 5"'$  (1.651 m)   Wt 116.8 kg   SpO2 94%   BMI 42.85 kg/m   Examination:  General exam: Appears calm and comfortable Respiratory system: Clear to auscultation except maybe some mild bibasilar rales. Respiratory effort normal. Cardiovascular system: S1 & S2 heard, RRR. No murmurs, rubs, gallops or clicks. Gastrointestinal system: Abdomen is nondistended, soft and nontender.  Normal bowel sounds heard. Central nervous system: Alert and oriented. No focal neurological deficits. Musculoskeletal: Trace BLE edema. No calf tenderness Skin: No cyanosis. No rashes Psychiatry: Judgement and insight appear normal. Mood & affect appropriate.    Data Reviewed: I have personally reviewed following labs and imaging studies  CBC Lab Results  Component Value Date   WBC 9.9 02/10/2022   RBC 5.12 (H) 02/10/2022   HGB 12.8 02/10/2022   HCT 41.6 02/10/2022   MCV 81.3 02/10/2022   MCH 25.0 (L) 02/10/2022   PLT 274 02/10/2022   MCHC 30.8 02/10/2022   RDW 17.5 (H) 02/10/2022   LYMPHSABS 0.7 10/01/2020   MONOABS 0.8 10/01/2020   EOSABS 0.4 10/01/2020   BASOSABS 0.0 67/05/4579     Last metabolic panel Lab Results  Component Value Date   NA 135 02/12/2022   K 4.0 02/12/2022   CL 103 02/12/2022   CO2  24 02/12/2022   BUN 10 02/12/2022   CREATININE 0.64 02/12/2022   GLUCOSE 195 (H) 02/12/2022   GFRNONAA >60 02/12/2022   CALCIUM 9.3 02/12/2022   PROT 6.8 02/10/2022   ALBUMIN 2.7 (L) 02/10/2022   BILITOT 0.6 02/10/2022   ALKPHOS 57 02/10/2022   AST 20  02/10/2022   ALT 14 02/10/2022   ANIONGAP 8 02/12/2022    GFR: Estimated Creatinine Clearance: 83.6 mL/min (by C-G formula based on SCr of 0.64 mg/dL).  No results found for this or any previous visit (from the past 240 hour(s)).    Radiology Studies: ECHOCARDIOGRAM COMPLETE  Result Date: 02/10/2022    ECHOCARDIOGRAM REPORT   Patient Name:   [name] Date of Exam: 02/10/2022 Medical Rec #:  [id number]     Height:       65.0 in Accession #:    [id number]    Weight:       261.0 lb Date of Birth:  24-Dec-1950     BSA:          2.215 m Patient Age:    54 years      BP:           123/66 mmHg Patient Gender: F             HR:           79 bpm. Exam Location:  Inpatient Procedure: 2D Echo, Cardiac Doppler and Color Doppler Indications:    Dyspnea  History:        Patient has prior history of Echocardiogram examinations, most                 recent 03/17/2019. COPD; Risk Factors:Hypertension and Diabetes.  Sonographer:    Eartha Inch Referring Phys: [id number] [place]  Sonographer Comments: Technically difficult study due to poor echo windows, suboptimal apical window, suboptimal parasternal window and suboptimal subcostal window. Image acquisition challenging due to patient body habitus, Image acquisition challenging due to respiratory motion and Image acquisition challenging due to COPD. IMPRESSIONS  1. Left ventricular ejection fraction, by estimation, is 60 to 65%. The left ventricle has normal function. The left ventricle has no regional wall motion abnormalities. There is mild left ventricular hypertrophy. Left ventricular diastolic parameters are consistent with Grade I diastolic dysfunction (impaired relaxation).  2. Right ventricular systolic function is normal. The right ventricular size is normal. Tricuspid regurgitation signal is inadequate for assessing PA pressure.  3. No evidence of mitral valve regurgitation.  4. Aortic valve regurgitation is not visualized.  5. The inferior vena cava  is normal in size with greater than 50% respiratory variability, suggesting right atrial pressure of 3 mmHg. Comparison(s): No significant change from prior study. FINDINGS  Left Ventricle: Left ventricular ejection fraction, by estimation, is 60 to 65%. The left ventricle has normal function. The left ventricle has no regional wall motion abnormalities. The left ventricular internal cavity size was normal in size. There is  mild left ventricular hypertrophy. Left ventricular diastolic parameters are consistent with Grade I diastolic dysfunction (impaired relaxation). Right Ventricle: The right ventricular size is normal. Right ventricular systolic function is normal. Tricuspid regurgitation signal is inadequate for assessing PA pressure. Left Atrium: Left atrial size was normal in size. Right Atrium: Right atrial size was normal in size. Pericardium: There is no evidence of pericardial effusion. Mitral Valve: No evidence of mitral valve regurgitation. Tricuspid Valve: The tricuspid valve is normal in structure. Tricuspid valve regurgitation is not demonstrated. Aortic Valve: Aortic valve  regurgitation is not visualized. Pulmonic Valve: Pulmonic valve regurgitation is not visualized. Aorta: The aortic root and ascending aorta are structurally normal, with no evidence of dilitation. Venous: The inferior vena cava is normal in size with greater than 50% respiratory variability, suggesting right atrial pressure of 3 mmHg.  LEFT VENTRICLE PLAX 2D LVIDd:         3.10 cm   Diastology LVIDs:         1.80 cm   LV e' medial:    9.14 cm/s LV PW:         1.10 cm   LV E/e' medial:  5.2 LV IVS:        1.10 cm   LV e' lateral:   7.83 cm/s LVOT diam:     2.20 cm   LV E/e' lateral: 6.1 LV SV:         78 LV SV Index:   35 LVOT Area:     3.80 cm  RIGHT VENTRICLE RV S prime:     11.20 cm/s TAPSE (M-mode): 1.8 cm LEFT ATRIUM             Index        RIGHT ATRIUM           Index LA diam:        3.00 cm 1.35 cm/m   RA Area:     15.40  cm LA Vol (A2C):   22.8 ml 10.29 ml/m  RA Volume:   35.10 ml  15.85 ml/m LA Vol (A4C):   22.0 ml 9.93 ml/m LA Biplane Vol: 24.3 ml 10.97 ml/m  AORTIC VALVE LVOT Vmax:   101.00 cm/s LVOT Vmean:  74.300 cm/s LVOT VTI:    0.204 m  AORTA Ao Root diam: 3.20 cm Ao Asc diam:  3.60 cm MITRAL VALVE MV Area (PHT): 2.93 cm    SHUNTS MV Decel Time: 259 msec    Systemic VTI:  0.20 m MV E velocity: 47.80 cm/s  Systemic Diam: 2.20 cm MV A velocity: 69.10 cm/s MV E/A ratio:  0.69 Mary Scientist, physiological signed by Phineas Inches Signature Date/Time: 02/10/2022/12:50:03 PM    Final       LOS: 2 days    Cordelia Poche, MD [facility] 02/12/2022, 10:16 AM   If 7PM-7AM, please contact night-coverage [email or web address]

## 2022-02-13 ENCOUNTER — Inpatient Hospital Stay (HOSPITAL_COMMUNITY): Payer: Medicare Other

## 2022-02-13 DIAGNOSIS — J9601 Acute respiratory failure with hypoxia: Secondary | ICD-10-CM

## 2022-02-13 DIAGNOSIS — E876 Hypokalemia: Secondary | ICD-10-CM | POA: Diagnosis not present

## 2022-02-13 DIAGNOSIS — E119 Type 2 diabetes mellitus without complications: Secondary | ICD-10-CM | POA: Diagnosis not present

## 2022-02-13 DIAGNOSIS — I10 Essential (primary) hypertension: Secondary | ICD-10-CM | POA: Diagnosis not present

## 2022-02-13 LAB — BASIC METABOLIC PANEL
Anion gap: 7 (ref 5–15)
BUN: 8 mg/dL (ref 8–23)
CO2: 29 mmol/L (ref 22–32)
Calcium: 9.6 mg/dL (ref 8.9–10.3)
Chloride: 98 mmol/L (ref 98–111)
Creatinine, Ser: 0.69 mg/dL (ref 0.44–1.00)
GFR, Estimated: >60 mL/min (ref >60)
Glucose, Bld: 227 mg/dL — ABNORMAL HIGH (ref 70–99)
Potassium: 3.9 mmol/L (ref 3.5–5.1)
Sodium: 134 mmol/L — ABNORMAL LOW (ref 135–145)

## 2022-02-13 LAB — GLUCOSE, CAPILLARY
Glucose-Capillary: 209 mg/dL — ABNORMAL HIGH (ref 70–99)
Glucose-Capillary: 273 mg/dL — ABNORMAL HIGH (ref 70–99)
Glucose-Capillary: 193 mg/dL — ABNORMAL HIGH (ref 70–99)
Glucose-Capillary: 237 mg/dL — ABNORMAL HIGH (ref 70–99)

## 2022-02-13 MED ORDER — IPRATROPIUM-ALBUTEROL 0.5-2.5 (3) MG/3ML IN SOLN
3.0000 mL | Freq: Four times a day (QID) | RESPIRATORY_TRACT | Status: DC
Start: 2022-02-13 — End: 2022-02-14
  Administered 2022-02-13 – 2022-02-14 (×3): 3 mL via RESPIRATORY_TRACT
  Filled 2022-02-13 (×4): qty 3

## 2022-02-13 MED ORDER — BUDESONIDE 0.25 MG/2ML IN SUSP
0.2500 mg | Freq: Two times a day (BID) | RESPIRATORY_TRACT | Status: DC
  Administered 2022-02-13 – 2022-02-16 (×6): 0.25 mg via RESPIRATORY_TRACT
  Filled 2022-02-13 (×6): qty 2

## 2022-02-13 MED ORDER — PREDNISONE 20 MG PO TABS
40.0000 mg | ORAL_TABLET | Freq: Every day | ORAL | Status: AC
  Administered 2022-02-14 – 2022-02-15 (×2): 40 mg via ORAL
  Filled 2022-02-13 (×2): qty 2

## 2022-02-13 MED ORDER — PREDNISONE 20 MG PO TABS: 20.0000 mg | ORAL_TABLET | Freq: Every day | ORAL | Status: DC

## 2022-02-13 MED ORDER — FUROSEMIDE 40 MG PO TABS
40.0000 mg | ORAL_TABLET | Freq: Every day | ORAL | Status: DC
Start: 2022-02-14 — End: 2022-02-16
  Administered 2022-02-14 – 2022-02-16 (×3): 40 mg via ORAL
  Filled 2022-02-13 (×3): qty 1

## 2022-02-13 MED ORDER — PREDNISONE 20 MG PO TABS
30.0000 mg | ORAL_TABLET | Freq: Every day | ORAL | Status: DC
  Administered 2022-02-16: 30 mg via ORAL
  Filled 2022-02-13: qty 1

## 2022-02-13 MED ORDER — ARFORMOTEROL TARTRATE 15 MCG/2ML IN NEBU
15.0000 ug | INHALATION_SOLUTION | Freq: Two times a day (BID) | RESPIRATORY_TRACT | Status: DC
  Administered 2022-02-13 – 2022-02-16 (×6): 15 ug via RESPIRATORY_TRACT
  Filled 2022-02-13 (×6): qty 2

## 2022-02-13 MED ORDER — GUAIFENESIN ER 600 MG PO TB12
600.0000 mg | ORAL_TABLET | Freq: Two times a day (BID) | ORAL | Status: DC
  Administered 2022-02-13 – 2022-02-16 (×6): 600 mg via ORAL
  Filled 2022-02-13 (×6): qty 1

## 2022-02-13 MED ORDER — PREDNISONE 10 MG PO TABS
10.0000 mg | ORAL_TABLET | Freq: Every day | ORAL | Status: DC
Start: 2022-02-20 — End: 2022-02-16

## 2022-02-13 MED ORDER — DOXYCYCLINE HYCLATE 100 MG PO TABS
100.0000 mg | ORAL_TABLET | Freq: Two times a day (BID) | ORAL | Status: DC
Start: 2022-02-13 — End: 2022-02-16
  Administered 2022-02-13 – 2022-02-16 (×7): 100 mg via ORAL
  Filled 2022-02-13 (×7): qty 1

## 2022-02-13 NOTE — Progress Notes (Signed)
PROGRESS NOTE    [name]  FIE:[id number] DOB: 08-23-1950 DOA: 02/09/2022 PCP: Antony Blackbird, MD   Brief Narrative: Joy Becker is a 71 y.o. female with a history of COPD, hypothyroidism, diabetes mellitus type 2, hypertension, hyperlipidemia. Patient presented secondary to shortness of breath and found to have a likely heart failure exacerbation. Lasix IV initiated.   Assessment and Plan:  Acute respiratory failure with hypoxia Present on admission with concern for COPD exacerbation. Patient with associated dyspnea on exertion. Currently stable on 3 but needing up to 6 L/min when ambulating. -Wean to room air as able -Ambulate with pulse ox daily  Acute on chronic diastolic heart failure Chest imaging significant for ground glass opacities possibly reflecting pulmonary edema vs infection. Patient started on Lasix IV for treatment. Transthoracic Echocardiogram (8/18) significant for LVEF of 60-65% and grade 1 diastolic dysfunction. Weight on admission of 118.4 kg (261 lbs). Weights and UOP not accurately documented. Cannot accurately assess effectiveness of diuresis.  -Switch to Lasix 40 mg PO daily -Continue BMP daily -Continue potassium supplementation -Strict in and out; daily weights -Consult pulmonology for recommendations prior to discharge  COPD Documented as acute but per history does not appear to have had wheezing and has not been treated with steroids. -Continue Incruse Ellipta and Duoneb  Primary hypertension -Continue losartan and metoprolol  Diabetes mellitus type 2 Hemoglobin A1C of 8.5%. Patient is on [place] as an outpatient. Patient started on SSI while inpatient. -Continue SSI  Hypothyroidism -Continue Synthroid  Hyperlipidemia -Continue Crestor  Hypokalemia -Potassium supplementation  Morbid obesity Body mass index is 42.85 kg/m.   DVT prophylaxis: Lovenox Code Status:   Code Status: Full Code Family Communication: None at  bedside Disposition Plan: Discharge home once able to wean oxygen. May need to discharge home with oxygen; if so, will discharge likely in 24 hours.   Consultants:  None  Procedures:  Transthoracic Echocardiogram (8/18)  Antimicrobials: None    Subjective: Patient still with significant dyspnea on exertion. No cough or chest pain.  Objective: BP 121/77 (BP Location: Left Arm)   Pulse 84   Temp 98.4 F (36.9 C) (Oral)   Resp 18   Ht '5\' 5"'$  (1.651 m)   Wt 116.8 kg   SpO2 99%   BMI 42.85 kg/m   Examination:  General exam: Appears calm and comfortable Respiratory system: No wheezing. Some mild bibasilar rales. Respiratory effort normal. Cardiovascular system: S1 & S2 heard, RRR. No murmurs, rubs, gallops or clicks. Gastrointestinal system: Abdomen is obese but otherwise non-distended, soft and non-tender. No organomegaly or masses felt. Normal bowel sounds heard. Central nervous system: Alert and oriented. No focal neurological deficits. Musculoskeletal: 1+ BLE edema. No calf tenderness Skin: No cyanosis. No rashes Psychiatry: Judgement and insight appear normal. Mood & affect appropriate.    Data Reviewed: I have personally reviewed following labs and imaging studies  CBC Lab Results  Component Value Date   WBC 9.9 02/10/2022   RBC 5.12 (H) 02/10/2022   HGB 12.8 02/10/2022   HCT 41.6 02/10/2022   MCV 81.3 02/10/2022   MCH 25.0 (L) 02/10/2022   PLT 274 02/10/2022   MCHC 30.8 02/10/2022   RDW 17.5 (H) 02/10/2022   LYMPHSABS 0.7 10/01/2020   MONOABS 0.8 10/01/2020   EOSABS 0.4 10/01/2020   BASOSABS 0.0 16/60/6301     Last metabolic panel Lab Results  Component Value Date   NA 134 (L) 02/13/2022   K 3.9 02/13/2022   CL 98 02/13/2022  CO2 29 02/13/2022   BUN 8 02/13/2022   CREATININE 0.69 02/13/2022   GLUCOSE 227 (H) 02/13/2022   GFRNONAA >60 02/13/2022   CALCIUM 9.6 02/13/2022   PROT 6.8 02/10/2022   ALBUMIN 2.7 (L) 02/10/2022   BILITOT 0.6  02/10/2022   ALKPHOS 57 02/10/2022   AST 20 02/10/2022   ALT 14 02/10/2022   ANIONGAP 7 02/13/2022    GFR: Estimated Creatinine Clearance: 83.6 mL/min (by C-G formula based on SCr of 0.69 mg/dL).  No results found for this or any previous visit (from the past 240 hour(s)).    Radiology Studies: DG CHEST PORT 1 VIEW  Result Date: 02/13/2022 CLINICAL DATA:  Acute respiratory failure with hypoxia EXAM: PORTABLE CHEST 1 VIEW COMPARISON:  02/09/2022 chest radiograph. FINDINGS: Stable cardiomediastinal silhouette with normal heart size. No pneumothorax. No pleural effusion. No residual pulmonary edema. Mild bibasilar scarring versus atelectasis, stable. IMPRESSION: 1. No residual pulmonary edema. 2. Stable mild bibasilar scarring versus atelectasis. Electronically Signed   By: Ilona Sorrel M.D.   On: 02/13/2022 08:52      LOS: 3 days    Cordelia Poche, MD [facility] 02/13/2022, 12:55 PM   If 7PM-7AM, please contact night-coverage [email or web address]

## 2022-02-13 NOTE — Consult Note (Signed)
NAME:  Joy Becker, MRN:  [id number], DOB:  13-May-1951, LOS: 3 ADMISSION DATE:  02/09/2022, CONSULTATION DATE:  02/13/2022 REFERRING MD:  [facility], CHIEF COMPLAINT:  Slow to resolve COPD, hypoxemia   History of Present Illness:  Joy Becker is a 71 y.o. female former smoker quit 2021)  with a history of COPD, hypothyroidism, diabetes mellitus type 2, hypertension, hyperlipidemia. Patient presented 8/18 with several days of shortness of breath . She was found to have heart failure and dyspnea was through to be a heart failure exacerbation. She has been diuresed, net negative 321 cc's,  and is still requiring 3 L [place] at rest and 6 L with ambulation. [facility] now suspect there may be a COPD flare component to her hypoxia. Pulmonary has been consulted to assist with care of slow to resolve COPD/ heart Failure exacerbation.   BNP was 49 on admission . Troponin was 5, D dimer was 0.97, Mag 1.9, HGB was 12.8 Pertinent  Medical History    Past Medical History:  Diagnosis Date   COPD (chronic obstructive pulmonary disease) ([place])    Diabetes mellitus without complication ([place])    High cholesterol    Hypertension    Hypothyroidism    Vertigo      Significant Hospital Events: Including procedures, antibiotic start and stop dates in addition to other pertinent events   02/09/2022 >> Admission with acute on chronic respiratory failure , acute on chronic heart failure  02/13/2022>> Pulmonary consulted.   Interim History / Subjective:  Pt. States she feels better than when she was admitted, but she does not know why she is still so short of breath. Pt. Endorses coughing up some brown secretions , she is afebrile, WBC on 8/18 was 9.9, and has not been checked since.  Labs 02/13/22>>  Na 134 ( was 135),  K 3.9, Glucose 227, Creatinine 0.69 Imaging reviewed personally: 02/13/2022 CXR >>Mild, stable  bibasilar scarring vs atelectasis 02/09/2022 CTA>> No PE,  Enlargement of the main pulmonary artery which can be  seen with pulmonary artery hypertension, Diffuse patchy ground-glass opacities may represent pulmonary edema or infection. Subpleural opacities in the lung bases may be related to chronic scarring. Mild bilateral hilar lymphadenopathy 02/10/2022 Echo>> EF of 60-65%, LV normal function, mild left ventricular hypertrophy, Grade 1 diastolic dysfunction, unable to assess PA pressures    Objective   Blood pressure 121/77, pulse 84, temperature 98.4 F (36.9 C), temperature source Oral, resp. rate 18, height '5\' 5"'$  (1.651 m), weight 116.8 kg, SpO2 99 %.        Intake/Output Summary (Last 24 hours) at 02/13/2022 1238 Last data filed at 02/13/2022 1013 Gross per 24 hour  Intake 213 ml  Output --  Net 213 ml   Filed Weights   02/09/22 1613 02/10/22 0715 02/11/22 0500  Weight: 118.4 kg 118.4 kg 116.8 kg    Examination: General:  Awake and alert, sitting in chair, in NAD HENT:  NCAT, thick neck, No LAD, MM pink and moist Lungs: Bilateral chest excursion, clear upper lobes with rales per bases. Diminished breath sounds bilaterally Cardiovascular:  S1, S2, RRR, No RMG Abdomen:  soft, NT, ND, BS +, Body mass index is 42.85 kg/m.  Extremities:  No obvious deformities, Bilateral lower extremity edema 1-2 +, L>R, brisk refill Neuro:  Awake alert and oriented x 3, MAE x 4 GU:  Not assessed  Resolved Hospital Problem list     Assessment & Plan:  Slow to resolve Acute on Chronic Respiratory Failure in  setting of COPD and Heart Failure Exacerbations Plan Continue DuoNeb and Incruse Sputum Culture  Doxycycline 100 mg BID x 7 days  Prednisone 40 mg x 2 days, 30 mg x 2 days, 20 mg x 2 days 10 mg x 2 days then stop.  Aggressive Pulmonary Toilet  IS and /flutter valve OOB to chair  PT/ OT for physical deconditioning Titrate oxygen to maintain sats > 90% Will need OP follow up with PFT's and reassessment of inhaler needs  Continue lasix as ordered    Best Practice (right click and "Reselect all  SmartList Selections" daily)   Diet/type: Regular consistency (see orders) Heart smart DVT prophylaxis: LMWH GI prophylaxis: H2B Lines: yes and it is still needed PICC Foley:  No Code Status:  full code Last date of multidisciplinary goals of care discussion [Discussion with patient 02/13/2022]  Labs   CBC: Recent Labs  Lab 02/09/22 1151 02/10/22 0325  WBC 9.8 9.9  HGB 12.9 12.8  HCT 39.5 41.6  MCV 78.7* 81.3  PLT 277 119    Basic Metabolic Panel: Recent Labs  Lab 02/09/22 1151 02/10/22 0325 02/11/22 0100 02/12/22 0209 02/13/22 0937  NA 140 139 139 135 134*  K 3.4* 3.6 3.6 4.0 3.9  CL 108 102 104 103 98  CO2 '26 29 31 24 29  '$ GLUCOSE 171* 198* 206* 195* 227*  BUN 6* 6* '9 10 8  '$ CREATININE 0.73 0.85 0.75 0.64 0.69  CALCIUM 9.4 9.0 9.3 9.3 9.6  MG  --  1.9  --   --   --    GFR: Estimated Creatinine Clearance: 83.6 mL/min (by C-G formula based on SCr of 0.69 mg/dL). Recent Labs  Lab 02/09/22 1151 02/10/22 0325  WBC 9.8 9.9    Liver Function Tests: Recent Labs  Lab 02/10/22 0325  AST 20  ALT 14  ALKPHOS 57  BILITOT 0.6  PROT 6.8  ALBUMIN 2.7*   No results for input(s): "LIPASE", "AMYLASE" in the last 168 hours. No results for input(s): "AMMONIA" in the last 168 hours.  ABG No results found for: "PHART", "PCO2ART", "PO2ART", "HCO3", "TCO2", "ACIDBASEDEF", "O2SAT"   Coagulation Profile: No results for input(s): "INR", "PROTIME" in the last 168 hours.  Cardiac Enzymes: No results for input(s): "CKTOTAL", "CKMB", "CKMBINDEX", "TROPONINI" in the last 168 hours.  HbA1C: Hgb A1c MFr Bld  Date/Time Value Ref Range Status  02/10/2022 03:25 AM 8.5 (H) 4.8 - 5.6 % Final    Comment:    (NOTE) Pre diabetes:          5.7%-6.4%  Diabetes:              >6.4%  Glycemic control for   <7.0% adults with diabetes     CBG: Recent Labs  Lab 02/12/22 1641 02/12/22 2115 02/12/22 2258 02/13/22 0814 02/13/22 1141  GLUCAP 186* 241* 232* 209* 273*     Review of Systems:    + for shortness of breath, sometimes productive cough with brown secretions , fatigue, lower extremity edema  Past Medical History:  She,  has a past medical history of COPD (chronic obstructive pulmonary disease) ([place]), Diabetes mellitus without complication ([place]), High cholesterol, Hypertension, Hypothyroidism, and Vertigo.   Surgical History:  History reviewed. No pertinent surgical history.   Social History:   reports that she has quit smoking. Her smoking use included cigarettes. She has quit using smokeless tobacco. She reports that she does not drink alcohol and does not use drugs.   Family History:  Her family  history includes Aneurysm in her mother; Diabetes in her mother.   Allergies Allergies  Allergen Reactions   Ace Inhibitors Other (See Comments)    cough   Azithromycin Other (See Comments)    Unaware      Home Medications  Prior to Admission medications   Medication Sig Start Date End Date Taking? Authorizing Provider  acetaminophen (TYLENOL) 500 MG tablet Take 500-1,000 mg by mouth every 8 (eight) hours as needed for moderate pain or headache.   Yes [provider]  albuterol (VENTOLIN HFA) 108 (90 Base) MCG/ACT inhaler Inhale 1-2 puffs into the lungs every 6 (six) hours as needed for wheezing or shortness of breath. 10/21/19  Yes [provider]  alendronate (FOSAMAX) 70 MG tablet Take 70 mg by mouth every Sunday. Take with a full glass of water on an empty stomach.   Yes [provider]  ammonium lactate (AMLACTIN) 12 % lotion Apply 1 application topically as needed for dry skin. Patient taking differently: Apply 1 application  topically daily as needed for dry skin. 08/22/19  Yes Felipa Furnace, DPM  aspirin EC 81 MG tablet Take 81 mg by mouth daily. Swallow whole.   Yes [provider]  empagliflozin (JARDIANCE) 10 MG TABS tablet Take 10 mg by mouth daily.   Yes [provider]  famotidine  (PEPCID) 20 MG tablet Take 20 mg by mouth 2 (two) times daily. 10/16/19  Yes [provider]  furosemide (LASIX) 20 MG tablet Take 20 mg by mouth daily.   Yes [provider]  INCRUSE ELLIPTA 62.5 MCG/INH AEPB Inhale 1 puff into the lungs daily. 08/06/19  Yes [provider]  JANUVIA 100 MG tablet Take 100 mg by mouth daily. 08/09/19  Yes [provider]  levothyroxine (SYNTHROID) 100 MCG tablet Take 100 mcg by mouth daily. 01/06/22  Yes [provider]  linaclotide (LINZESS) 72 MCG capsule Take 72 mcg by mouth daily before breakfast.   Yes [provider]  losartan (COZAAR) 50 MG tablet Take 50 mg by mouth daily.   Yes [provider]  metoprolol succinate (TOPROL-XL) 50 MG 24 hr tablet Take 50 mg by mouth daily. Take with or immediately following a meal.   Yes [provider]  montelukast (SINGULAIR) 10 MG tablet Take 10 mg by mouth at bedtime.   Yes [provider]  Polyethyl Glycol-Propyl Glycol (SYSTANE) 0.4-0.3 % SOLN Place 1 drop into both eyes daily as needed (dry eyes).   Yes [provider]  rosuvastatin (CRESTOR) 10 MG tablet Take 10 mg by mouth daily.   Yes [provider]  Vitamin D, Ergocalciferol, (DRISDOL) 1.25 MG (50000 UNIT) CAPS capsule Take 50,000 Units by mouth every Saturday.   Yes [provider]  levothyroxine (SYNTHROID) 125 MCG tablet Take 125 mcg by mouth daily before breakfast. Patient not taking: Reported on 02/09/2022    [provider]     Pulmonary APP Time 45 minutes   Magdalen Spatz, MSN, AGACNP-BC [place] for personal pager PCCM on call pager [phone]  02/13/2022 3:48 PM

## 2022-02-13 NOTE — Care Management Important Message (Signed)
Important Message  Patient Details  Name: Joy Becker MRN: [id number] Date of Birth: 23-Sep-1950   Medicare Important Message Given:  Yes     Hannah Beat 02/13/2022, 12:27 PM

## 2022-02-13 NOTE — Progress Notes (Signed)
SATURATION QUALIFICATIONS: (This note is used to comply with regulatory documentation for home oxygen)  Patient Saturations on Room Air at Rest = 89%  Patient Saturations on Room Air while Ambulating = 84%  Patient Saturations on 2 Liters of oxygen while Ambulating = 92%  Please briefly explain why patient needs home oxygen: Patient's oxygen drops to an unsafe level without oxygen.

## 2022-02-13 NOTE — TOC Progression Note (Signed)
Transition of Care [facility]) - Progression Note    Patient Details  Name: Joy Becker MRN: [id number] Date of Birth: 09-30-1950  Transition of Care [facility]) CM/SW Contact  Jacalyn Lefevre Edson Snowball, RN Phone Number: 02/13/2022, 12:01 PM  Clinical Narrative:     Spoke to patient at bedside.    Confirmed face sheet information.    Patient's new PCP is Cammie Fulp .    Patient lives at [facility]. Daughter provides assistance when needed. Patient has transportation to appointments. She has her "routine medications" filled through [employer] and delivered to her daughters address. Any "emergency or new prescriptions " she has filled at [facility] on [address]. Her daughter brings medications to her.    Patient has walker , cane and shower chair at home. Patient normally independent at home. Patient does not have home oxygen    Discussed home health PT. Patient in agreement. Referral given to and accepted by [facility] with [facility].    If home oxygen needed at discharge will need order and ambulation saturation note.   Expected Discharge Plan: [place] Barriers to Discharge: Continued Medical Work up  Expected Discharge Plan and Services Expected Discharge Plan: [place]   Discharge Planning Services: CM Consult Post Acute Care Choice: [place] arrangements for the past 2 months: Single Family Home                 DME Arranged: N/A DME Agency: NA       HH Arranged: PT HH Agency: [place] Date [place]: 02/13/22 Time [place] Agency Contacted: 1200 Representative spoke with at [place]: Tommi Rumps   Social Determinants of Health ([place]) Interventions    Readmission Risk Interventions     No data to display

## 2022-02-13 NOTE — Inpatient Diabetes Management (Signed)
Inpatient Diabetes Program Recommendations  AACE/ADA: New Consensus Statement on Inpatient Glycemic Control   Target Ranges:  Prepandial:   less than 140 mg/dL      Peak postprandial:   less than 180 mg/dL (1-2 hours)      Critically ill patients:  140 - 180 mg/dL    Latest Reference Range & Units 02/13/22 08:14 02/13/22 11:41  Glucose-Capillary 70 - 99 mg/dL 209 (H) 273 (H)    Latest Reference Range & Units 02/12/22 08:12 02/12/22 12:26 02/12/22 16:41 02/12/22 21:15 02/12/22 22:58  Glucose-Capillary 70 - 99 mg/dL 182 (H) 185 (H) 186 (H) 241 (H) 232 (H)   Review of Glycemic Control  Diabetes history: DM2 Outpatient Diabetes medications: Jardiance 10 mg daily, Januvia 100 mg daily Current orders for Inpatient glycemic control: Novolog 0-9 units TID with meals, Novolog 0-5 units QHS; Prednisone taper  Inpatient Diabetes Program Recommendations:    Insulin: If steroids are continued, please consider ordering Novolog 3 units TID with meals for meal coverage if patient eats at least 50% of meals.  Thanks, Barnie Alderman, RN, MSN, [place] Diabetes Coordinator Inpatient Diabetes Program [phone] (Team Pager from 8am to [place])

## 2022-02-14 ENCOUNTER — Telehealth: Payer: Self-pay | Admitting: Pulmonary Disease

## 2022-02-14 DIAGNOSIS — J9601 Acute respiratory failure with hypoxia: Secondary | ICD-10-CM | POA: Diagnosis not present

## 2022-02-14 DIAGNOSIS — E876 Hypokalemia: Secondary | ICD-10-CM | POA: Diagnosis not present

## 2022-02-14 DIAGNOSIS — I10 Essential (primary) hypertension: Secondary | ICD-10-CM | POA: Diagnosis not present

## 2022-02-14 DIAGNOSIS — E119 Type 2 diabetes mellitus without complications: Secondary | ICD-10-CM | POA: Diagnosis not present

## 2022-02-14 LAB — BASIC METABOLIC PANEL
Anion gap: 6 (ref 5–15)
BUN: 13 mg/dL (ref 8–23)
CO2: 29 mmol/L (ref 22–32)
Calcium: 10.3 mg/dL (ref 8.9–10.3)
Chloride: 101 mmol/L (ref 98–111)
Creatinine, Ser: 0.84 mg/dL (ref 0.44–1.00)
GFR, Estimated: >60 mL/min (ref >60)
Glucose, Bld: 238 mg/dL — ABNORMAL HIGH (ref 70–99)
Potassium: 4.1 mmol/L (ref 3.5–5.1)
Sodium: 136 mmol/L (ref 135–145)

## 2022-02-14 LAB — GLUCOSE, CAPILLARY
Glucose-Capillary: 244 mg/dL — ABNORMAL HIGH (ref 70–99)
Glucose-Capillary: 234 mg/dL — ABNORMAL HIGH (ref 70–99)
Glucose-Capillary: 284 mg/dL — ABNORMAL HIGH (ref 70–99)
Glucose-Capillary: 338 mg/dL — ABNORMAL HIGH (ref 70–99)
Glucose-Capillary: 373 mg/dL — ABNORMAL HIGH (ref 70–99)

## 2022-02-14 MED ORDER — FUROSEMIDE 10 MG/ML IJ SOLN
40.0000 mg | Freq: Once | INTRAMUSCULAR | Status: AC
  Administered 2022-02-14: 40 mg via INTRAVENOUS
  Filled 2022-02-14: qty 4

## 2022-02-14 NOTE — Progress Notes (Signed)
PROGRESS NOTE    Joy Becker  AYT:[id number] DOB: [birth year] DOA: 02/09/2022 PCP: Antony Blackbird, MD   Brief Narrative: Joy Becker is a 71 y.o. female with a history of COPD, hypothyroidism, diabetes mellitus type 2, hypertension, hyperlipidemia. Patient presented secondary to shortness of breath and found to have a likely heart failure exacerbation. Lasix IV initiated. Pulmonology consulted on 8/21 and have started treatment for COPD exacerbation   Assessment and Plan:  Acute respiratory failure with hypoxia Present on admission with concern for COPD exacerbation. Patient with associated dyspnea on exertion. Currently stable on 3 but needing up to 6 L/min when ambulating initially, now down to 2 L/min with ambulation. Pulmonology consulted on 8/21 and have recommended to treat as COPD exacerbation -Wean to room air as able -Ambulate with pulse ox daily  Acute on chronic diastolic heart failure Chest imaging significant for ground glass opacities possibly reflecting pulmonary edema vs infection. Patient started on Lasix IV for treatment. Transthoracic Echocardiogram (8/18) significant for LVEF of 60-65% and grade 1 diastolic dysfunction. Weight on admission of 118.4 kg (261 lbs). Weights and UOP not accurately documented. Cannot accurately assess effectiveness of diuresis.  -Lasix 40 mg PO daily and add Lasix 40 mg IV for this evening x1 -Continue BMP daily -Continue potassium supplementation -Strict in and out; daily weights  COPD Documented as acute but per history does not appear to have had wheezing and has not been treated with steroids. -Continue Incruse Ellipta and Duoneb -Pulmonology recommendations: doxycycline, prednisone with taper, outpatient pulmonology follow-up  Primary hypertension -Continue losartan and metoprolol  Diabetes mellitus type 2 Hemoglobin A1C of 8.5%. Patient is on [place] as an outpatient. Patient started on SSI while  inpatient. -Continue SSI  Hypothyroidism -Continue Synthroid  Hyperlipidemia -Continue Crestor  Hypokalemia -Potassium supplementation  Morbid obesity Body mass index is 43.22 kg/m.   DVT prophylaxis: Lovenox Code Status:   Code Status: Full Code Family Communication: None at bedside Disposition Plan: Discharge home once able to wean oxygen if able. Anticipate discharge home in 24 hours if improved.   Consultants:  Pulmonology  Procedures:  Transthoracic Echocardiogram (8/18)  Antimicrobials: Doxycycline    Subjective: Patient still with significant dyspnea on exertion. No cough or chest pain.  Objective: BP (!) 117/57 (BP Location: Left Wrist)   Pulse 98   Temp 98.2 F (36.8 C) (Oral)   Resp 17   Ht '5\' 5"'$  (1.651 m)   Wt 117.8 kg   SpO2 99%   BMI 43.22 kg/m   Examination:  General exam: Appears calm and comfortable Respiratory system: Clear to auscultation; no wheezing or rales. Respiratory effort normal. Cardiovascular system: S1 & S2 heard, RRR. Gastrointestinal system: Abdomen is obese but otherwise appears non-distended, soft and nontender. Normal bowel sounds heard. Central nervous system: Alert and oriented. No focal neurological deficits. Musculoskeletal: Trace LLE edema. No calf tenderness Skin: No cyanosis. No rashes Psychiatry: Judgement and insight appear normal. Mood & affect appropriate.     Data Reviewed: I have personally reviewed following labs and imaging studies  CBC Lab Results  Component Value Date   WBC 9.9 02/10/2022   RBC 5.12 (H) 02/10/2022   HGB 12.8 02/10/2022   HCT 41.6 02/10/2022   MCV 81.3 02/10/2022   MCH 25.0 (L) 02/10/2022   PLT 274 02/10/2022   MCHC 30.8 02/10/2022   RDW 17.5 (H) 02/10/2022   LYMPHSABS 0.7 10/01/2020   MONOABS 0.8 10/01/2020   EOSABS 0.4 10/01/2020   BASOSABS 0.0 10/01/2020  Last metabolic panel Lab Results  Component Value Date   NA 136 02/14/2022   K 4.1 02/14/2022   CL 101  02/14/2022   CO2 29 02/14/2022   BUN 13 02/14/2022   CREATININE 0.84 02/14/2022   GLUCOSE 238 (H) 02/14/2022   GFRNONAA >60 02/14/2022   CALCIUM 10.3 02/14/2022   PROT 6.8 02/10/2022   ALBUMIN 2.7 (L) 02/10/2022   BILITOT 0.6 02/10/2022   ALKPHOS 57 02/10/2022   AST 20 02/10/2022   ALT 14 02/10/2022   ANIONGAP 6 02/14/2022    GFR: Estimated Creatinine Clearance: 80 mL/min (by C-G formula based on SCr of 0.84 mg/dL).  No results found for this or any previous visit (from the past 240 hour(s)).    Radiology Studies: DG CHEST PORT 1 VIEW  Result Date: 02/13/2022 CLINICAL DATA:  Acute respiratory failure with hypoxia EXAM: PORTABLE CHEST 1 VIEW COMPARISON:  02/09/2022 chest radiograph. FINDINGS: Stable cardiomediastinal silhouette with normal heart size. No pneumothorax. No pleural effusion. No residual pulmonary edema. Mild bibasilar scarring versus atelectasis, stable. IMPRESSION: 1. No residual pulmonary edema. 2. Stable mild bibasilar scarring versus atelectasis. Electronically Signed   By: Ilona Sorrel M.D.   On: 02/13/2022 08:52      LOS: 4 days    Cordelia Poche, MD [facility] 02/14/2022, 10:23 AM   If 7PM-7AM, please contact night-coverage [email or web address]

## 2022-02-14 NOTE — Inpatient Diabetes Management (Signed)
Inpatient Diabetes Program Recommendations  AACE/ADA: New Consensus Statement on Inpatient Glycemic Control   Target Ranges:  Prepandial:   less than 140 mg/dL      Peak postprandial:   less than 180 mg/dL (1-2 hours)      Critically ill patients:  140 - 180 mg/dL    Latest Reference Range & Units 02/14/22 04:37 02/14/22 08:18 02/14/22 11:59  Glucose-Capillary 70 - 99 mg/dL 244 (H) 234 (H) 284 (H)    Latest Reference Range & Units 02/13/22 08:14 02/13/22 11:41 02/13/22 16:53 02/13/22 20:28  Glucose-Capillary 70 - 99 mg/dL 209 (H) 273 (H) 193 (H) 237 (H)   Review of Glycemic Control  Diabetes history: DM2 Outpatient Diabetes medications: Jardiance 10 mg daily, Januvia 100 mg daily Current orders for Inpatient glycemic control: Novolog 0-9 units TID with meals, Novolog 0-5 units QHS; Prednisone taper   Inpatient Diabetes Program Recommendations:     Insulin: If steroids are continued, please consider ordering Novolog 4 units TID with meals for meal coverage if patient eats at least 50% of meals.   Thanks, Barnie Alderman, RN, MSN, [place] Diabetes Coordinator Inpatient Diabetes Program [phone] (Team Pager from 8am to [place])

## 2022-02-14 NOTE — Progress Notes (Signed)
SATURATION QUALIFICATIONS: (This note is used to comply with regulatory documentation for home oxygen)  Patient Saturations on Room Air at Rest = 86%  Patient Saturations on Room Air while Ambulating = 85%  Patient Saturations on 3 Liters of oxygen while Ambulating = 93%  Please briefly explain why patient needs home oxygen: To maintain oxygen saturations > 90% at rest and with ambulation  Wyona Almas, PT, [place] Office [phone]

## 2022-02-14 NOTE — Progress Notes (Signed)
   NAME:  Joy Becker, MRN:  [id number], DOB:  [birth year], LOS: 4 ADMISSION DATE:  02/09/2022, CONSULTATION DATE:  8/21 REFERRING MD:  Lonny Prude, CHIEF COMPLAINT:  Dyspnea   History of Present Illness:  71 y/o female with COPD presented on 8/18 to [facility] with dyspnea.  She was treated by [facility] for a CHF exacerbation but failed to improve so PCCM consulted on 8/22 for further management.   Pertinent  Medical History  COPD Hypertension Hypothyroidism Cigarette smoker> quit 2021 DM2  Significant Hospital Events: Including procedures, antibiotic start and stop dates in addition to other pertinent events   02/09/2022 >> Admission with acute on chronic respiratory failure , acute on chronic heart failure  02/09/2022 CTA>> No PE,  Enlargement of the main pulmonary artery which can be seen with pulmonary artery hypertension, Diffuse patchy ground-glass opacities may represent pulmonary edema or infection. Subpleural opacities in the lung bases may be related to chronic scarring. Mild bilateral hilar lymphadenopathy 02/10/2022 Echo>> EF of 60-65%, LV normal function, mild left ventricular hypertrophy, Grade 1 diastolic dysfunction, unable to assess PA pressures  02/13/2022 CXR >>Mild, stable  bibasilar scarring vs atelectasis 8/21 PCCM consult, added COPD treatment; qualified for 2L O2 with exertion yesterday  Interim History / Subjective:  Feels better today Remains on oxygen Hasn't been out of bed  Objective   Blood pressure 111/65, pulse 79, temperature 98.2 F (36.8 C), temperature source Oral, resp. rate 18, height '5\' 5"'$  (1.651 m), weight 117.8 kg, SpO2 100 %.        Intake/Output Summary (Last 24 hours) at 02/14/2022 0756 Last data filed at 02/13/2022 1013 Gross per 24 hour  Intake 3 ml  Output --  Net 3 ml   Filed Weights   02/10/22 0715 02/11/22 0500 02/13/22 2033  Weight: 118.4 kg 116.8 kg 117.8 kg    Examination: General:  Chronically ill appearing, resting comfortably in  chair HENT: NCAT OP clear PULM: Diminished bilaterally B, normal effort CV: RRR, no mgr GI: BS+, soft, nontender MSK: normal bulk and tone Derm: acute and chronic leg edema noted Neuro: awake, alert, no distress, MAEW   Resolved Hospital Problem list     Assessment & Plan:  Acute on chronic respiratory failure with hypoxemia  CHF exacerbation COPD exacerbation> presumed COPD  Plan Continue doxycycline for 7 days Continue prednisone as ordered Incruise and duoneb to continue PT/OT for physical deconditioning Out of bed today Encouraged patient to use her incentive spirometer more Needs outpatient PFT Needs f/u with pulmonary medicine as outpatient   She is close to hospital discharge: suspect with another 24 hours of diuresis she will be ready to leave.  Will need f/u in pulmonary clinic   Best Practice (right click and "Reselect all SmartList Selections" daily)   Per Nunda  Critical care time: n/a     Roselie Awkward, MD [place] [facility] Pager: [phone] Cell: [phone] After 7:00 pm call Elink  [phone]

## 2022-02-14 NOTE — Progress Notes (Signed)
Physical Therapy Treatment Patient Details Name: Joy Becker MRN: [id number] DOB: 1951-01-25 Today's Date: 02/14/2022   History of Present Illness 71 y/o female presented to ED on 02/09/22 for SOB x 3 days and found to be hypoxic at MD office. Admitted for acute COPD exacerbation and acute hypoxic respiratory failure. PMH: T2DM, COPD, HTN    PT Comments    Pt progressing steadily towards her physical therapy goals. Received up in chair. Able to perform seated warm up exercises and progressive ambulation. Pt ambulating 130 ft x 2 with a Rollator and rest break in between bouts. Requiring 3L O2 to maintain sats > 90%. Will continue to benefit from mobilization to improve cardiopulmonary endurance.    Recommendations for follow up therapy are one component of a multi-disciplinary discharge planning process, led by the attending physician.  Recommendations may be updated based on patient status, additional functional criteria and insurance authorization.  Follow Up Recommendations  Home health PT     Assistance Recommended at Discharge Intermittent Supervision/Assistance  Patient can return home with the following Assistance with cooking/housework;Assist for transportation;Help with stairs or ramp for entrance   Equipment Recommendations  None recommended by PT    Recommendations for Other Services       Precautions / Restrictions Precautions Precautions: Fall Precaution Comments: watch O2 Restrictions Weight Bearing Restrictions: No     Mobility  Bed Mobility               General bed mobility comments: OOB in chair    Transfers Overall transfer level: Modified independent                      Ambulation/Gait Ambulation/Gait assistance: Supervision Gait Distance (Feet): 260 Feet (2 bouts of 130) Assistive device: Rollator (4 wheels) Gait Pattern/deviations: Step-through pattern, Decreased stride length Gait velocity: decreased     General Gait  Details: increased trunk flexion, supervision for safety, cues for activity pacing   Stairs             Wheelchair Mobility    Modified Rankin (Stroke Patients Only)       Balance Overall balance assessment: Mild deficits observed, not formally tested                                          Cognition Arousal/Alertness: Awake/alert Behavior During Therapy: WFL for tasks assessed/performed Overall Cognitive Status: Within Functional Limits for tasks assessed                                          Exercises General Exercises - Lower Extremity Long Arc Quad: Both, 10 reps, Seated Hip Flexion/Marching: Both, 10 reps, Seated    General Comments        Pertinent Vitals/Pain Pain Assessment Pain Assessment: No/denies pain    Home Living                          Prior Function            PT Goals (current goals can now be found in the care plan section) Acute Rehab PT Goals Patient Stated Goal: to get better and go home Potential to Achieve Goals: Good Progress towards PT goals: Progressing toward goals  Frequency    Min 3X/week      PT Plan Current plan remains appropriate    Co-evaluation              AM-PAC PT "6 Clicks" Mobility   Outcome Measure  Help needed turning from your back to your side while in a flat bed without using bedrails?: None Help needed moving from lying on your back to sitting on the side of a flat bed without using bedrails?: None Help needed moving to and from a bed to a chair (including a wheelchair)?: A Little Help needed standing up from a chair using your arms (e.g., wheelchair or bedside chair)?: A Little Help needed to walk in hospital room?: A Little Help needed climbing 3-5 steps with a railing? : A Little 6 Click Score: 20    End of Session Equipment Utilized During Treatment: Oxygen Activity Tolerance: Patient tolerated treatment well Patient left: in  chair;with call bell/phone within reach Nurse Communication: Mobility status PT Visit Diagnosis: Muscle weakness (generalized) (M62.81);Unsteadiness on feet (R26.81)     Time: 9150-5697 PT Time Calculation (min) (ACUTE ONLY): 20 min  Charges:  $Therapeutic Activity: 8-22 mins                     Wyona Almas, PT, DPT Acute Rehabilitation Services Office [phone]    Deno Etienne 02/14/2022, 1:08 PM

## 2022-02-14 NOTE — Telephone Encounter (Signed)
Please arrange outpatient follow up for possible COPD in 2-3 weeks with Dr. Valeta Harms or an APP.

## 2022-02-15 ENCOUNTER — Telehealth: Payer: Self-pay | Admitting: Pulmonary Disease

## 2022-02-15 DIAGNOSIS — J9601 Acute respiratory failure with hypoxia: Secondary | ICD-10-CM | POA: Diagnosis not present

## 2022-02-15 LAB — BASIC METABOLIC PANEL
Anion gap: 8 (ref 5–15)
BUN: 18 mg/dL (ref 8–23)
CO2: 27 mmol/L (ref 22–32)
Calcium: 10.9 mg/dL — ABNORMAL HIGH (ref 8.9–10.3)
Chloride: 100 mmol/L (ref 98–111)
Creatinine, Ser: 1.04 mg/dL — ABNORMAL HIGH (ref 0.44–1.00)
GFR, Estimated: 58 mL/min — ABNORMAL LOW (ref >60)
Glucose, Bld: 258 mg/dL — ABNORMAL HIGH (ref 70–99)
Potassium: 4.5 mmol/L (ref 3.5–5.1)
Sodium: 135 mmol/L (ref 135–145)

## 2022-02-15 LAB — GLUCOSE, CAPILLARY
Glucose-Capillary: 226 mg/dL — ABNORMAL HIGH (ref 70–99)
Glucose-Capillary: 219 mg/dL — ABNORMAL HIGH (ref 70–99)
Glucose-Capillary: 303 mg/dL — ABNORMAL HIGH (ref 70–99)
Glucose-Capillary: 366 mg/dL — ABNORMAL HIGH (ref 70–99)

## 2022-02-15 NOTE — Telephone Encounter (Signed)
PCCM:  Patient needs hospital follow up with APP in 2-3 weeks.  Thanks  [place], DO [facility] 02/15/2022 1:09 PM

## 2022-02-15 NOTE — Progress Notes (Signed)
PROGRESS NOTE    Joy Becker  HBZ:[id number] DOB: 1950/11/08 DOA: 02/09/2022 PCP: Antony Blackbird, MD   Brief Narrative:  Joy Becker is a 71 y.o. female with a history of COPD current baseline on room air, hypothyroidism, diabetes mellitus type 2, hypertension, hyperlipidemia. Patient presented secondary to shortness of breath and found to have a likely heart failure exacerbation. Lasix IV initiated. Pulmonology consulted on 8/21 and have started treatment for COPD exacerbation.  Assessment & Plan:   Principal Problem:   Acute respiratory failure with hypoxia (HCC) Active Problems:   High cholesterol   Diabetes mellitus without complication (HCC)   Hypertension   Hypothyroidism   COPD (chronic obstructive pulmonary disease) (HCC)   Shortness of breath   Hypokalemia  Acute respiratory failure with hypoxia, improving -Present on admission with concern for COPD exacerbation -baseline on room air -Continue to wean oxygen aggressively - ambulatory oxygen screen pending (previously requiring 6 L -2 L yesterday) -Pulmonology consulted on 8/21 and have recommended to treat as COPD exacerbation -currently signed off, will follow outpatient, appreciate insight recommendations   Acute on chronic diastolic heart failure -Chest imaging significant for ground glass opacities possibly reflecting pulmonary edema vs infection.  -Tolerated diuresis well  -Echocardiogram (8/18) significant for LVEF of 60-65% and grade 1 diastolic dysfunction. -Strict in and out; daily weights   COPD, questionably in acute exacerbation -Continue steroid taper, doxycycline per pulmonology, plan for outpatient follow-up per their schedule -Continue Incruse Ellipta and Duoneb -Oxygen screen pending as above   Primary hypertension -Continue losartan and metoprolol   Diabetes mellitus type 2 -Hemoglobin A1C of 8.5%. Patient is on [place] as an outpatient. -Continue SSI   Hypothyroidism -Continue  Synthroid   Hyperlipidemia -Continue Crestor   Hypokalemia -Potassium supplementation   Morbid obesity -Body mass index is 43.22 kg/m.     DVT prophylaxis: Lovenox Code Status:   Code Status: Full Code Family Communication: None at bedside Disposition Plan: Likely discharge in the next 24 hours pending clinical course    Consultants:  Pulmonology   Procedures:  Transthoracic Echocardiogram (8/18)   Antimicrobials: Doxycycline      Subjective: No acute issues or events overnight, respiratory status continues to improve but not yet back to baseline, remains dyspneic with minimal exertion.  Objective: Vitals:   02/14/22 1623 02/14/22 1955 02/14/22 2057 02/15/22 0443  BP: 113/65  100/67 (!) 117/52  Pulse: 78 82 95 74  Resp: '18 16 17 18  '$ Temp: 97.8 F (36.6 C)  98 F (36.7 C) (!) 97.5 F (36.4 C)  TempSrc: Oral  Oral Oral  SpO2: 96% 96% 96% 95%  Weight:      Height:        Intake/Output Summary (Last 24 hours) at 02/15/2022 0736 Last data filed at 02/14/2022 2056 Gross per 24 hour  Intake 360 ml  Output --  Net 360 ml   Filed Weights   02/10/22 0715 02/11/22 0500 02/13/22 2033  Weight: 118.4 kg 116.8 kg 117.8 kg   Examination:  General:  Pleasantly resting in bed, No acute distress. HEENT:  Normocephalic atraumatic.  Sclerae nonicteric, noninjected.  Extraocular movements intact bilaterally. Neck:  Without mass or deformity.  Trachea is midline. Lungs: Diminished without overt wheezes rales or rhonchi. Heart:  Regular rate and rhythm.  Without murmurs, rubs, or gallops. Abdomen:  Soft, nontender, nondistended.  Without guarding or rebound. Extremities: Without cyanosis, clubbing, edema, or obvious deformity. Vascular:  Dorsalis pedis and posterior tibial pulses palpable bilaterally. Skin:  Warm and dry, no erythema, no ulcerations.  Data Reviewed: I have personally reviewed following labs and imaging studies  CBC: Recent Labs  Lab 02/09/22 1151  02/10/22 0325  WBC 9.8 9.9  HGB 12.9 12.8  HCT 39.5 41.6  MCV 78.7* 81.3  PLT 277 932   Basic Metabolic Panel: Recent Labs  Lab 02/10/22 0325 02/11/22 0100 02/12/22 0209 02/13/22 0937 02/14/22 0151 02/15/22 0127  NA 139 139 135 134* 136 135  K 3.6 3.6 4.0 3.9 4.1 4.5  CL 102 104 103 98 101 100  CO2 '29 31 24 29 29 27  '$ GLUCOSE 198* 206* 195* 227* 238* 258*  BUN 6* '9 10 8 13 18  '$ CREATININE 0.85 0.75 0.64 0.69 0.84 1.04*  CALCIUM 9.0 9.3 9.3 9.6 10.3 10.9*  MG 1.9  --   --   --   --   --    GFR: Estimated Creatinine Clearance: 64.6 mL/min (A) (by C-G formula based on SCr of 1.04 mg/dL (H)). Liver Function Tests: Recent Labs  Lab 02/10/22 0325  AST 20  ALT 14  ALKPHOS 57  BILITOT 0.6  PROT 6.8  ALBUMIN 2.7*   No results for input(s): "LIPASE", "AMYLASE" in the last 168 hours. No results for input(s): "AMMONIA" in the last 168 hours. Coagulation Profile: No results for input(s): "INR", "PROTIME" in the last 168 hours. Cardiac Enzymes: No results for input(s): "CKTOTAL", "CKMB", "CKMBINDEX", "TROPONINI" in the last 168 hours. BNP (last 3 results) No results for input(s): "PROBNP" in the last 8760 hours. HbA1C: No results for input(s): "HGBA1C" in the last 72 hours. CBG: Recent Labs  Lab 02/14/22 0437 02/14/22 0818 02/14/22 1159 02/14/22 1623 02/14/22 2056  GLUCAP 244* 234* 284* 338* 373*   Lipid Profile: No results for input(s): "CHOL", "HDL", "LDLCALC", "TRIG", "CHOLHDL", "LDLDIRECT" in the last 72 hours. Thyroid Function Tests: No results for input(s): "TSH", "T4TOTAL", "FREET4", "T3FREE", "THYROIDAB" in the last 72 hours. Anemia Panel: No results for input(s): "VITAMINB12", "FOLATE", "FERRITIN", "TIBC", "IRON", "RETICCTPCT" in the last 72 hours. Sepsis Labs: No results for input(s): "PROCALCITON", "LATICACIDVEN" in the last 168 hours.  No results found for this or any previous visit (from the past 240 hour(s)).   Radiology Studies: No results  found.  Scheduled Meds:  arformoterol  15 mcg Nebulization BID   aspirin EC  81 mg Oral Daily   budesonide (PULMICORT) nebulizer solution  0.25 mg Nebulization BID   doxycycline  100 mg Oral Q12H   enoxaparin (LOVENOX) injection  40 mg Subcutaneous Q24H   famotidine  20 mg Oral BID   furosemide  40 mg Oral Daily   guaiFENesin  600 mg Oral BID   insulin aspart  0-5 Units Subcutaneous QHS   insulin aspart  0-9 Units Subcutaneous TID WC   levothyroxine  100 mcg Oral QAC breakfast   linaclotide  72 mcg Oral QAC breakfast   losartan  50 mg Oral Daily   metoprolol succinate  50 mg Oral Daily   montelukast  10 mg Oral QHS   potassium chloride  40 mEq Oral Daily   predniSONE  40 mg Oral Q breakfast   Followed by   Derrill Memo ON 02/16/2022] predniSONE  30 mg Oral Q breakfast   Followed by   Derrill Memo ON 02/18/2022] predniSONE  20 mg Oral Q breakfast   Followed by   Derrill Memo ON 02/20/2022] predniSONE  10 mg Oral Q breakfast   rosuvastatin  10 mg Oral Daily   sodium chloride flush  3 mL Intravenous Q12H   umeclidinium bromide  1 puff Inhalation Daily   Vitamin D (Ergocalciferol)  50,000 Units Oral Q Sat   Continuous Infusions:  sodium chloride       LOS: 5 days   Time spent: 57mn  Kj Imbert C Peightyn Roberson, DO [facility]  If 7PM-7AM, please contact night-coverage [email or web address]  02/15/2022, 7:36 AM

## 2022-02-15 NOTE — Progress Notes (Signed)
SATURATION QUALIFICATIONS: (This note is used to comply with regulatory documentation for home oxygen)  Patient Saturations on Room Air at Rest = 92%  Patient Saturations on Room Air while Ambulating = 88%  Patient Saturations on 2 Liters of oxygen while Ambulating = 91%  Please briefly explain why patient needs home oxygen: Requires supplemental O2 to maintain spO2 >90%

## 2022-02-15 NOTE — Progress Notes (Signed)
   NAME:  Joy Becker, MRN:  [id number], DOB:  1951-04-21, LOS: 5 ADMISSION DATE:  02/09/2022, CONSULTATION DATE:  8/21 REFERRING MD:  Lonny Prude, CHIEF COMPLAINT:  Dyspnea   History of Present Illness:  71 y/o female with COPD presented on 8/18 to [facility] with dyspnea.  She was treated by [facility] for a CHF exacerbation but failed to improve so PCCM consulted on 8/22 for further management.   Pertinent  Medical History  COPD Hypertension Hypothyroidism Cigarette smoker> quit 2021 DM2  Significant Hospital Events: Including procedures, antibiotic start and stop dates in addition to other pertinent events   02/09/2022 >> Admission with acute on chronic respiratory failure , acute on chronic heart failure  02/09/2022 CTA>> No PE,  Enlargement of the main pulmonary artery which can be seen with pulmonary artery hypertension, Diffuse patchy ground-glass opacities may represent pulmonary edema or infection. Subpleural opacities in the lung bases may be related to chronic scarring. Mild bilateral hilar lymphadenopathy 02/10/2022 Echo>> EF of 60-65%, LV normal function, mild left ventricular hypertrophy, Grade 1 diastolic dysfunction, unable to assess PA pressures  02/13/2022 CXR >>Mild, stable  bibasilar scarring vs atelectasis 8/21 PCCM consult, added COPD treatment; qualified for 2L O2 with exertion yesterday  Interim History / Subjective:  No distress at rest Objective   Blood pressure (!) 91/55, pulse 84, temperature 97.7 F (36.5 C), temperature source Oral, resp. rate 18, height '5\' 5"'$  (1.651 m), weight 117.8 kg, SpO2 99 %.        Intake/Output Summary (Last 24 hours) at 02/15/2022 0858 Last data filed at 02/14/2022 2056 Gross per 24 hour  Intake 240 ml  Output --  Net 240 ml   Filed Weights   02/10/22 0715 02/11/22 0500 02/13/22 2033  Weight: 118.4 kg 116.8 kg 117.8 kg    Examination: General: Morbid obese female no acute distress HEENT: MM pink/moist Short neck no JVD appreciated Neuro:  Grossly intact without focal defect CV: Heart sounds are regular PULM: Diminished throughout  GI: soft, bsx4 active  GU: Voids Extremities: warm/dry, 1+ edema  Skin: no rashes or lesions     Resolved Hospital Problem list     Assessment & Plan:  Acute on chronic respiratory failure with hypoxemia  CHF exacerbation COPD exacerbation> presumed COPD  Plan Continue doxycycline for 7 days total Continue prednisone as ordered Continue bronchodilators Mobilize for deconditioning Encourage pulmonary toilet Outpatient follow-up with PFTs He is being set up with outpatient with Dr. Valeta Harms     She is close to hospital discharge: suspect with another 24 hours of diuresis she will be ready to leave.  She has office appointment set up.   Best Practice (right click and "Reselect all SmartList Selections" daily)   Per [facility]  Critical care time: n/a     Richardson Landry Monterrio Gerst ACNP Acute Care Nurse Practitioner [place] Please consult Amion 02/15/2022, 8:58 AM

## 2022-02-15 NOTE — Progress Notes (Signed)
Physical Therapy Treatment Patient Details Name: Joy Becker MRN: [id number] DOB: 03-23-1951 Today's Date: 02/15/2022   History of Present Illness 71 y/o female presented to ED on 02/09/22 for SOB x 3 days and found to be hypoxic at MD office. Admitted for acute COPD exacerbation and acute hypoxic respiratory failure. PMH: T2DM, COPD, HTN    PT Comments    Patient continues to require supplemental O2 of at least 2L to maintain spO2 >90% during mobility. Ambulated in room on RA with spO2 88%. Instructed patient on pursed lip breathing and activity pacing. Overall at supervision level for mobility. D/c plan remains appropriate.     Recommendations for follow up therapy are one component of a multi-disciplinary discharge planning process, led by the attending physician.  Recommendations may be updated based on patient status, additional functional criteria and insurance authorization.  Follow Up Recommendations  Home health PT     Assistance Recommended at Discharge Intermittent Supervision/Assistance  Patient can return home with the following Assistance with cooking/housework;Assist for transportation;Help with stairs or ramp for entrance   Equipment Recommendations  None recommended by PT    Recommendations for Other Services       Precautions / Restrictions Precautions Precautions: Fall Precaution Comments: watch O2 Restrictions Weight Bearing Restrictions: No     Mobility  Bed Mobility               General bed mobility comments: OOB in chair    Transfers Overall transfer level: Modified independent Equipment used: Rolling Lauralynn Loeb (2 wheels)                    Ambulation/Gait Ambulation/Gait assistance: Supervision Gait Distance (Feet): 110 Feet (110, +20') Assistive device: Rolling Rickey Sadowski (2 wheels) Gait Pattern/deviations: Step-through pattern, Decreased stride length Gait velocity: decreased     General Gait Details: supervision for safety.  Trunk flexed throughout   Stairs             Wheelchair Mobility    Modified Rankin (Stroke Patients Only)       Balance Overall balance assessment: Mild deficits observed, not formally tested                                          Cognition Arousal/Alertness: Awake/alert Behavior During Therapy: WFL for tasks assessed/performed Overall Cognitive Status: Within Functional Limits for tasks assessed                                          Exercises      General Comments General comments (skin integrity, edema, etc.): Ambulated on 2L O2 [place] in hallway with spO2 as low as 90%. Ambulated in room on RA with drop to 88%      Pertinent Vitals/Pain Pain Assessment Pain Assessment: No/denies pain    Home Living                          Prior Function            PT Goals (current goals can now be found in the care plan section) Acute Rehab PT Goals Patient Stated Goal: to get better and go home PT Goal Formulation: With patient Time For Goal Achievement: 02/24/22 Potential to Achieve Goals: Good Progress towards  PT goals: Progressing toward goals    Frequency    Min 3X/week      PT Plan Current plan remains appropriate    Co-evaluation              AM-PAC PT "6 Clicks" Mobility   Outcome Measure  Help needed turning from your back to your side while in a flat bed without using bedrails?: None Help needed moving from lying on your back to sitting on the side of a flat bed without using bedrails?: None Help needed moving to and from a bed to a chair (including a wheelchair)?: A Little Help needed standing up from a chair using your arms (e.g., wheelchair or bedside chair)?: A Little Help needed to walk in hospital room?: A Little Help needed climbing 3-5 steps with a railing? : A Little 6 Click Score: 20    End of Session Equipment Utilized During Treatment: Oxygen Activity Tolerance: Patient  tolerated treatment well Patient left: in chair;with call bell/phone within reach Nurse Communication: Mobility status PT Visit Diagnosis: Muscle weakness (generalized) (M62.81);Unsteadiness on feet (R26.81)     Time: 7225-7505 PT Time Calculation (min) (ACUTE ONLY): 29 min  Charges:  $Gait Training: 8-22 mins $Therapeutic Activity: 8-22 mins                     Kaeleigh Westendorf A. Gilford Rile PT, DPT Acute Rehabilitation Services Office [phone]    Linna Hoff 02/15/2022, 5:14 PM

## 2022-02-16 DIAGNOSIS — J9601 Acute respiratory failure with hypoxia: Secondary | ICD-10-CM | POA: Diagnosis not present

## 2022-02-16 LAB — GLUCOSE, CAPILLARY
Glucose-Capillary: 260 mg/dL — ABNORMAL HIGH (ref 70–99)
Glucose-Capillary: 275 mg/dL — ABNORMAL HIGH (ref 70–99)
Glucose-Capillary: 382 mg/dL — ABNORMAL HIGH (ref 70–99)
Glucose-Capillary: 339 mg/dL — ABNORMAL HIGH (ref 70–99)

## 2022-02-16 LAB — BASIC METABOLIC PANEL
Anion gap: 6 (ref 5–15)
BUN: 20 mg/dL (ref 8–23)
CO2: 28 mmol/L (ref 22–32)
Calcium: 11 mg/dL — ABNORMAL HIGH (ref 8.9–10.3)
Chloride: 100 mmol/L (ref 98–111)
Creatinine, Ser: 1.04 mg/dL — ABNORMAL HIGH (ref 0.44–1.00)
GFR, Estimated: 58 mL/min — ABNORMAL LOW (ref >60)
Glucose, Bld: 324 mg/dL — ABNORMAL HIGH (ref 70–99)
Potassium: 4.8 mmol/L (ref 3.5–5.1)
Sodium: 134 mmol/L — ABNORMAL LOW (ref 135–145)

## 2022-02-16 MED ORDER — METHYLPREDNISOLONE 4 MG PO TBPK: ORAL_TABLET | ORAL | 0 refills | Status: DC

## 2022-02-16 MED ORDER — POTASSIUM CHLORIDE CRYS ER 20 MEQ PO TBCR: 20.0000 meq | EXTENDED_RELEASE_TABLET | Freq: Every day | ORAL | 1 refills | Status: AC

## 2022-02-16 MED ORDER — IPRATROPIUM-ALBUTEROL 0.5-2.5 (3) MG/3ML IN SOLN: 3.0000 mL | RESPIRATORY_TRACT | 1 refills | Status: AC | PRN

## 2022-02-16 MED ORDER — DOXYCYCLINE HYCLATE 100 MG PO TABS: 100.0000 mg | ORAL_TABLET | Freq: Two times a day (BID) | ORAL | 0 refills | Status: AC

## 2022-02-16 MED ORDER — BUDESONIDE 0.25 MG/2ML IN SUSP: 0.2500 mg | Freq: Two times a day (BID) | RESPIRATORY_TRACT | 12 refills | Status: AC

## 2022-02-16 MED ORDER — ARFORMOTEROL TARTRATE 15 MCG/2ML IN NEBU: 15.0000 ug | INHALATION_SOLUTION | Freq: Two times a day (BID) | RESPIRATORY_TRACT | 1 refills | Status: AC

## 2022-02-16 NOTE — Progress Notes (Signed)
SATURATION QUALIFICATIONS: (This note is used to comply with regulatory documentation for home oxygen)  Patient Saturations on Room Air at Rest = 91%  Patient Saturations on Room Air while Ambulating = 85%  Patient Saturations on 3 Liters of oxygen while Ambulating = 90%  Paulla Dolly

## 2022-02-16 NOTE — Progress Notes (Signed)
Mobility Specialist - Progress Note   02/16/22 1152  Mobility  Activity Ambulated independently in hallway  Level of Assistance Modified independent, requires aide device or extra time  Assistive Device Front wheel walker  Distance Ambulated (ft) 300 ft  Activity Response Tolerated well  $Mobility charge 1 Mobility    Pt received in recliner and agreeable to mobility. Ambulated on 3L O2. Left in recliner w/ call bell and all needs met.   Paulla Dolly Mobility Specialist

## 2022-02-16 NOTE — TOC Transition Note (Addendum)
Transition of Care [facility]) - CM/SW Discharge Note   Patient Details  Name: Joy Becker MRN: [id number] Date of Birth: [birth year]  [facility] CM/SW Contact:  Sharin Mons, RN Phone Number: 02/16/2022, 12:37 PM   Clinical Narrative:    Patient will DC to: Home Anticipated DC date: 02/16/2022 Family notified: yes Transport by: car   Per MD patient ready for DC today. RN, patient, patient's family, and Tommi Rumps with [facility] notified of DC. Order noted for home oxygen.Pt agreeable to home oxygen. Referral made with [employer]. Portable oxygen tank/concentrator will be delivered to beside prior to d/c. Pt states daughter to help and assist with care once d/c. Pt without Rx MED concerns. Post hospital f/u noted on AVS.  Fulp, Cammie, MD            Next Steps: Go on 02/20/2022 Instructions: Post hospital follow up appointment scheduled for 02/20/2022 with PCP at 1:50 pm   Daughter to provide transportation to home after she completes work @ 1730.    02/16/2022 @ 1341 pt d/c with nebulizer treatment. Pt states without DME nebulizer machine. Order placed for  home nebulizer and referral made with [employer]. Device will be delivered to bedside prior to d/c.  RNCM will sign off for now as intervention is no longer needed. Please consult [place] again if new needs arise.   Final next level of care: [place] Barriers to Discharge: No Barriers Identified   Patient Goals and CMS Choice Patient states their goals for this hospitalization and ongoing recovery are:: to return home CMS Medicare.gov Compare Post Acute Care list provided to:: Patient Choice offered to / list presented to : Patient  Discharge Placement                       Discharge Plan and Services   Discharge Planning Services: CM Consult Post Acute Care Choice: Home Health          DME Arranged: Oxygen DME Agency: [employer] Date DME Agency Contacted: 02/16/22 Time DME  Agency Contacted: [id number] Representative spoke with at DME Agency: [place]: PT,NA,SW [place]: [place] Date [place]: 02/13/22 Time [place]: 1200 Representative spoke with at [place]: Tommi Rumps  Social Determinants of Health ([place]) Interventions     Readmission Risk Interventions     No data to display

## 2022-02-16 NOTE — Discharge Summary (Signed)
Physician Discharge Summary  Marveline Profeta WLN:[id number] DOB: [birth year] DOA: 02/09/2022  PCP: Antony Blackbird, MD  Admit date: 02/09/2022 Discharge date: 02/16/2022  Admitted From: Home Disposition:  Home  Recommendations for Outpatient Follow-up:  Follow up with PCP in 1-2 weeks Follow up with Pulmonology as discussed Recommend sleep apnea study as discussed  Home Health:HHPT  Equipment/Devices:Oxygen  Discharge Condition:Stable  CODE STATUS:Full  Diet recommendation: Low salt, low fat, low carb diet    Brief/Interim Summary: Careen Mauch is a 71 y.o. female with a history of COPD current baseline on room air, hypothyroidism, diabetes mellitus type 2, hypertension, hyperlipidemia. Patient presented secondary to shortness of breath and found to have a likely heart failure exacerbation. Lasix IV initiated. Pulmonology consulted on 8/21 and have started treatment for COPD exacerbation.  Patient admitted as above with acute hypoxic respiratory failure multifactorial in the setting of volume overload, heart failure exacerbation, COPD exacerbation with poor outpatient medical follow-up.  She is since been diuresed and her respiratory medications have been titrated by pulmonology.  Appreciate insight recommendations.  On patient's new regimen she has improved quite rapidly over the past few days but continues to be hypoxic with exertion requiring oxygen, as such this will be discharged with her via home health.  Continue to follow-up closely with pulmonology for ongoing titration of oxygen, this may be her new baseline which we did discuss.  Patient also has notable hypoxia overnight, desatting into the mid 80s despite oxygen, we did recommend outpatient sleep study as well given she has known history of snoring and frequently wakes up on her own feeling like she cannot breathe.  Close follow-up with PCP pulmonology and cardiology as scheduled, if worsening symptoms we discussed reporting to  appropriate clinic or back to the ED as indicated.  Otherwise new medications outlined as below were discussed with at length.  We also discussed closely monitoring patient's diabetic diet over the next week to 10 days given she will be discharged on a steroid taper and after lengthy discussion we decided not to discharge patient on insulin given concern for hypoglycemia as her steroid taper were to wean down.  She may experience some mild hyperglycemia in the interim.  Discharge Diagnoses:  Principal Problem:   Acute respiratory failure with hypoxia (HCC) Active Problems:   High cholesterol   Diabetes mellitus without complication (HCC)   Hypertension   Hypothyroidism   COPD (chronic obstructive pulmonary disease) (HCC)   Shortness of breath   Hypokalemia  Acute respiratory failure with hypoxia, improving Cannot rule out obstructive sleep apnea -ongoing nocturnal hypoxia -Continue new regimen per pulmonology, outpatient follow-up as scheduled, continue oxygen as prescribed and continue to increase activity levels gradually over the next few weeks, sleep study also recommended as above.   Acute on chronic diastolic heart failure -Continue diuresis and potassium supplementation as above -Echocardiogram (8/18) significant for LVEF of 60-65% and grade 1 diastolic dysfunction. -Strict in and out; daily weights   COPD, questionably in acute exacerbation -Continue steroid taper, doxycycline per pulmonology, plan for outpatient follow-up per their schedule -Continue Incruse Ellipta and Duoneb -Oxygen provided as above   Primary hypertension -Continue losartan and metoprolol   Diabetes mellitus type 2 -Hemoglobin A1C of 8.5%. Patient is on [place] as an outpatient.   Hypothyroidism -Continue Synthroid   Hyperlipidemia -Continue Crestor   Hypokalemia -Potassium supplementation   Morbid obesity -Body mass index is 43.22 kg/m.   Discharge Instructions  Discharge  Instructions     Discharge  patient   Complete by: As directed    Discharge disposition: 06-Home-Health Care Svc   Discharge patient date: 02/16/2022   Face-to-face encounter (required for Medicare/Medicaid patients)   Complete by: As directed    [place] certify that this patient is under my care and that I, or a nurse practitioner or physician's assistant working with me, had a face-to-face encounter that meets the physician face-to-face encounter requirements with this patient on 02/16/2022. The encounter with the patient was in whole, or in part for the following medical condition(s) which is the primary reason for home health care (List medical condition): ambulatory dysfunction, dyspnea with exertion, hypoxia   The encounter with the patient was in whole, or in part, for the following medical condition, which is the primary reason for home health care: ambulatory dysfunction, dyspnea with exertion, hypoxia   I certify that, based on my findings, the following services are medically necessary home health services: Physical therapy   Reason for Medically Necessary Home Health Services:  Skilled Nursing- Change/Decline in Patient Status Therapy- Personnel officer, Public librarian Therapy- Instruction on Safe use of Assistive Devices for ADLs     My clinical findings support the need for the above services:  Unable to leave home safely without assistance and/or assistive device Shortness of breath with activity     Further, I certify that my clinical findings support that this patient is homebound due to:  Unable to leave home safely without assistance Shortness of Breath with activity     For home use only DME oxygen   Complete by: As directed    Length of Need: 6 Months   Mode or (Route): Nasal cannula   Liters per Minute: 2   Frequency: Continuous (stationary and portable oxygen unit needed)   Oxygen conserving device: No   Oxygen delivery system: Gas    Home Health   Complete by: As directed    To provide the following care/treatments:  PT Home Health Aide        Allergies as of 02/16/2022       Reactions   Ace Inhibitors Other (See Comments)   cough   Azithromycin Other (See Comments)   Unaware         Medication List     TAKE these medications    acetaminophen 500 MG tablet Commonly known as: TYLENOL Take 500-1,000 mg by mouth every 8 (eight) hours as needed for moderate pain or headache.   albuterol 108 (90 Base) MCG/ACT inhaler Commonly known as: VENTOLIN HFA Inhale 1-2 puffs into the lungs every 6 (six) hours as needed for wheezing or shortness of breath.   alendronate 70 MG tablet Commonly known as: FOSAMAX Take 70 mg by mouth every Sunday. Take with a full glass of water on an empty stomach.   ammonium lactate 12 % lotion Commonly known as: AmLactin Apply 1 application topically as needed for dry skin. What changed: when to take this   arformoterol 15 MCG/2ML Nebu Commonly known as: BROVANA Take 2 mLs (15 mcg total) by nebulization 2 (two) times daily.   aspirin EC 81 MG tablet Take 81 mg by mouth daily. Swallow whole.   budesonide 0.25 MG/2ML nebulizer solution Commonly known as: PULMICORT Take 2 mLs (0.25 mg total) by nebulization 2 (two) times daily.   doxycycline 100 MG tablet Commonly known as: VIBRA-TABS Take 1 tablet (100 mg total) by mouth every 12 (twelve) hours for 3 days.   empagliflozin 10  MG Tabs tablet Commonly known as: JARDIANCE Take 10 mg by mouth daily.   famotidine 20 MG tablet Commonly known as: PEPCID Take 20 mg by mouth 2 (two) times daily.   furosemide 20 MG tablet Commonly known as: LASIX Take 20 mg by mouth daily.   Incruse Ellipta 62.5 MCG/ACT Aepb Generic drug: umeclidinium bromide Inhale 1 puff into the lungs daily.   ipratropium-albuterol 0.5-2.5 (3) MG/3ML Soln Commonly known as: DUONEB Take 3 mLs by nebulization every 3 (three) hours as needed.   Januvia  100 MG tablet Generic drug: sitaGLIPtin Take 100 mg by mouth daily.   levothyroxine 100 MCG tablet Commonly known as: SYNTHROID Take 100 mcg by mouth daily. What changed: Another medication with the same name was removed. Continue taking this medication, and follow the directions you see here.   linaclotide 72 MCG capsule Commonly known as: LINZESS Take 72 mcg by mouth daily before breakfast.   losartan 50 MG tablet Commonly known as: COZAAR Take 50 mg by mouth daily.   methylPREDNISolone 4 MG Tbpk tablet Commonly known as: MEDROL DOSEPAK Taper as directed   metoprolol succinate 50 MG 24 hr tablet Commonly known as: TOPROL-XL Take 50 mg by mouth daily. Take with or immediately following a meal.   montelukast 10 MG tablet Commonly known as: SINGULAIR Take 10 mg by mouth at bedtime.   potassium chloride SA 20 MEQ tablet Commonly known as: KLOR-CON M Take 1 tablet (20 mEq total) by mouth daily. Start taking on: February 17, 2022   rosuvastatin 10 MG tablet Commonly known as: CRESTOR Take 10 mg by mouth daily.   Systane 0.4-0.3 % Soln Generic drug: Polyethyl Glycol-Propyl Glycol Place 1 drop into both eyes daily as needed (dry eyes).   Vitamin D (Ergocalciferol) 1.25 MG (50000 UNIT) Caps capsule Commonly known as: DRISDOL Take 50,000 Units by mouth every Saturday.               Durable Medical Equipment  (From admission, onward)           Start     Ordered   02/16/22 0000  For home use only DME oxygen       Question Answer Comment  Length of Need 6 Months   Mode or (Route) Nasal cannula   Liters per Minute 2   Frequency Continuous (stationary and portable oxygen unit needed)   Oxygen conserving device No   Oxygen delivery system Gas      02/16/22 1148            Follow-up Information     [facility] Follow up.   Specialty: Home Health Services Contact information: [address] [place]  [address] [phone]         Antony Blackbird, MD Follow up.   Specialty: Family Medicine Contact information: [address] [place] [address] [phone]                Allergies  Allergen Reactions   Ace Inhibitors Other (See Comments)    cough   Azithromycin Other (See Comments)    Unaware     Consultations: Pulmonology  Procedures/Studies: DG CHEST PORT 1 VIEW  Result Date: 02/13/2022 CLINICAL DATA:  Acute respiratory failure with hypoxia EXAM: PORTABLE CHEST 1 VIEW COMPARISON:  02/09/2022 chest radiograph. FINDINGS: Stable cardiomediastinal silhouette with normal heart size. No pneumothorax. No pleural effusion. No residual pulmonary edema. Mild bibasilar scarring versus atelectasis, stable. IMPRESSION: 1. No residual pulmonary edema.  2. Stable mild bibasilar scarring versus atelectasis. Electronically Signed   By: Ilona Sorrel M.D.   On: 02/13/2022 08:52   ECHOCARDIOGRAM COMPLETE  Result Date: 02/10/2022    ECHOCARDIOGRAM REPORT   Patient Name:   [facility] Date of Exam: 02/10/2022 Medical Rec #:  [id number]     Height:       65.0 in Accession #:    [id number]    Weight:       261.0 lb Date of Birth:  1951/03/10     BSA:          2.215 m Patient Age:    34 years      BP:           123/66 mmHg Patient Gender: F             HR:           79 bpm. Exam Location:  Inpatient Procedure: 2D Echo, Cardiac Doppler and Color Doppler Indications:    Dyspnea  History:        Patient has prior history of Echocardiogram examinations, most                 recent 03/17/2019. COPD; Risk Factors:Hypertension and Diabetes.  Sonographer:    Eartha Inch Referring Phys: [id number] [place]  Sonographer Comments: Technically difficult study due to poor echo windows, suboptimal apical window, suboptimal parasternal window and suboptimal subcostal window. Image acquisition challenging due to patient body habitus, Image acquisition challenging due to respiratory motion and  Image acquisition challenging due to COPD. IMPRESSIONS  1. Left ventricular ejection fraction, by estimation, is 60 to 65%. The left ventricle has normal function. The left ventricle has no regional wall motion abnormalities. There is mild left ventricular hypertrophy. Left ventricular diastolic parameters are consistent with Grade I diastolic dysfunction (impaired relaxation).  2. Right ventricular systolic function is normal. The right ventricular size is normal. Tricuspid regurgitation signal is inadequate for assessing PA pressure.  3. No evidence of mitral valve regurgitation.  4. Aortic valve regurgitation is not visualized.  5. The inferior vena cava is normal in size with greater than 50% respiratory variability, suggesting right atrial pressure of 3 mmHg. Comparison(s): No significant change from prior study. FINDINGS  Left Ventricle: Left ventricular ejection fraction, by estimation, is 60 to 65%. The left ventricle has normal function. The left ventricle has no regional wall motion abnormalities. The left ventricular internal cavity size was normal in size. There is  mild left ventricular hypertrophy. Left ventricular diastolic parameters are consistent with Grade I diastolic dysfunction (impaired relaxation). Right Ventricle: The right ventricular size is normal. Right ventricular systolic function is normal. Tricuspid regurgitation signal is inadequate for assessing PA pressure. Left Atrium: Left atrial size was normal in size. Right Atrium: Right atrial size was normal in size. Pericardium: There is no evidence of pericardial effusion. Mitral Valve: No evidence of mitral valve regurgitation. Tricuspid Valve: The tricuspid valve is normal in structure. Tricuspid valve regurgitation is not demonstrated. Aortic Valve: Aortic valve regurgitation is not visualized. Pulmonic Valve: Pulmonic valve regurgitation is not visualized. Aorta: The aortic root and ascending aorta are structurally normal, with no  evidence of dilitation. Venous: The inferior vena cava is normal in size with greater than 50% respiratory variability, suggesting right atrial pressure of 3 mmHg.  LEFT VENTRICLE PLAX 2D LVIDd:         3.10 cm   Diastology LVIDs:         1.80 cm  LV e' medial:    9.14 cm/s LV PW:         1.10 cm   LV E/e' medial:  5.2 LV IVS:        1.10 cm   LV e' lateral:   7.83 cm/s LVOT diam:     2.20 cm   LV E/e' lateral: 6.1 LV SV:         78 LV SV Index:   35 LVOT Area:     3.80 cm  RIGHT VENTRICLE RV S prime:     11.20 cm/s TAPSE (M-mode): 1.8 cm LEFT ATRIUM             Index        RIGHT ATRIUM           Index LA diam:        3.00 cm 1.35 cm/m   RA Area:     15.40 cm LA Vol (A2C):   22.8 ml 10.29 ml/m  RA Volume:   35.10 ml  15.85 ml/m LA Vol (A4C):   22.0 ml 9.93 ml/m LA Biplane Vol: 24.3 ml 10.97 ml/m  AORTIC VALVE LVOT Vmax:   101.00 cm/s LVOT Vmean:  74.300 cm/s LVOT VTI:    0.204 m  AORTA Ao Root diam: 3.20 cm Ao Asc diam:  3.60 cm MITRAL VALVE MV Area (PHT): 2.93 cm    SHUNTS MV Decel Time: 259 msec    Systemic VTI:  0.20 m MV E velocity: 47.80 cm/s  Systemic Diam: 2.20 cm MV A velocity: 69.10 cm/s MV E/A ratio:  0.69 Mary Scientist, physiological signed by Phineas Inches Signature Date/Time: 02/10/2022/12:50:03 PM    Final    CT Angio Chest PE W and/or Wo Contrast  Result Date: 02/09/2022 CLINICAL DATA:  Positive D-dimer.  Shortness of breath. EXAM: CT ANGIOGRAPHY CHEST WITH CONTRAST TECHNIQUE: Multidetector CT imaging of the chest was performed using the standard protocol during bolus administration of intravenous contrast. Multiplanar CT image reconstructions and MIPs were obtained to evaluate the vascular anatomy. RADIATION DOSE REDUCTION: This exam was performed according to the departmental dose-optimization program which includes automated exposure control, adjustment of the mA and/or kV according to patient size and/or use of iterative reconstruction technique. CONTRAST:  166m OMNIPAQUE IOHEXOL 350  MG/ML SOLN COMPARISON:  None Available. FINDINGS: Cardiovascular: The heart is enlarged. Aorta is normal in size. There are atherosclerotic calcifications of the aorta. There is no pericardial effusion. There is adequate opacification of the pulmonary arteries to the segmental level. There is no evidence for pulmonary embolism. The main pulmonary artery is enlarged which can be seen with pulmonary artery hypertension. Mediastinum/Nodes: There are nonenlarged mediastinal and hilar lymph nodes bilaterally. There are mildly enlarged left hilar lymph nodes measuring 11 mm short axis. There are mildly enlarged right hilar lymph nodes measuring 14 mm short axis. The visualized esophagus and thyroid gland are within normal limits. There is a small hiatal hernia. Lungs/Pleura: Diffuse patchy ground-glass opacities are seen throughout both lungs. There are some subpleural areas of linear opacities in both lung bases. There is no pleural effusion or pneumothorax. Trachea and central airways are patent. Upper Abdomen: No acute abnormality. Musculoskeletal: No chest wall abnormality. No acute or significant osseous findings. Review of the MIP images confirms the above findings. IMPRESSION: 1. No evidence for pulmonary embolism. 2. Cardiomegaly. 3. Enlargement of the main pulmonary artery which can be seen with pulmonary artery hypertension. 4. Diffuse patchy ground-glass opacities may represent pulmonary edema or infection.  5. Subpleural opacities in the lung bases may be related to chronic scarring. 6. Mild bilateral hilar lymphadenopathy. Aortic Atherosclerosis (ICD10-I70.0). Electronically Signed   By: Ronney Asters M.D.   On: 02/09/2022 18:22   VAS [place] LOWER EXTREMITY VENOUS (DVT) (7a-7p)  Result Date: 02/09/2022  Lower Venous DVT Study Patient Name:  [facility]  Date of Exam:   02/09/2022 Medical Rec #: [id number]      Accession #:    [id number] Date of Birth: 11/26/1950      Patient Gender: F Patient Age:   71 years  Exam Location:  [facility] Procedure:      VAS [place] LOWER EXTREMITY VENOUS (DVT) Referring Phys: JON KNAPP --------------------------------------------------------------------------------  Indications: Swelling.  Limitations: Poor ultrasound/tissue interface and body habitus. Comparison Study: Previous exam on 01/20/20 was negative for DVT. Performing Technologist: Rogelia Rohrer RVT, RDMS  Examination Guidelines: A complete evaluation includes B-mode imaging, spectral Doppler, color Doppler, and power Doppler as needed of all accessible portions of each vessel. Bilateral testing is considered an integral part of a complete examination. Limited examinations for reoccurring indications may be performed as noted. The reflux portion of the exam is performed with the patient in reverse Trendelenburg.  +-----+---------------+---------+-----------+----------+--------------+ RIGHTCompressibilityPhasicitySpontaneityPropertiesThrombus Aging +-----+---------------+---------+-----------+----------+--------------+ CFV  Full           Yes      Yes                                 +-----+---------------+---------+-----------+----------+--------------+   +---------+---------------+---------+-----------+----------+-------------------+ LEFT     CompressibilityPhasicitySpontaneityPropertiesThrombus Aging      +---------+---------------+---------+-----------+----------+-------------------+ CFV      Full           Yes      Yes                                      +---------+---------------+---------+-----------+----------+-------------------+ SFJ      Full                                                             +---------+---------------+---------+-----------+----------+-------------------+ FV Prox  Full           Yes      Yes                                      +---------+---------------+---------+-----------+----------+-------------------+ FV Mid   Full           Yes      Yes                                       +---------+---------------+---------+-----------+----------+-------------------+ FV DistalFull           Yes      Yes                                      +---------+---------------+---------+-----------+----------+-------------------+ PFV      Full                                                             +---------+---------------+---------+-----------+----------+-------------------+  POP      Full           Yes      Yes                                      +---------+---------------+---------+-----------+----------+-------------------+ PTV      Full                                                             +---------+---------------+---------+-----------+----------+-------------------+ PERO     Full                                         Not well visualized +---------+---------------+---------+-----------+----------+-------------------+    Summary: RIGHT: - No evidence of common femoral vein obstruction.  LEFT: - There is no evidence of deep vein thrombosis in the lower extremity.  - No cystic structure found in the popliteal fossa.  *See table(s) above for measurements and observations. Electronically signed by Monica Martinez MD on 02/09/2022 at 3:16:23 PM.    Final    DG Chest 2 View  Result Date: 02/09/2022 CLINICAL DATA:  shortness of breath EXAM: CHEST - 2 VIEW COMPARISON:  None Available. FINDINGS: Interstitial prominence. Top-normal heart size. No significant pleural effusion. No pneumothorax. No acute osseous abnormality. IMPRESSION: Age-indeterminate interstitial prominence may be chronic or reflect edema. Electronically Signed   By: Macy Mis M.D.   On: 02/09/2022 12:17     Subjective: No acute issues or events overnight denies nausea vomiting diarrhea constipation headache fevers chills or chest pain   Discharge Exam: Vitals:   02/16/22 0849 02/16/22 0915  BP:  (!) 108/56  Pulse: 67 66  Resp: 20 17  Temp:   97.6 F (36.4 C)  SpO2:  97%   Vitals:   02/15/22 2209 02/16/22 0506 02/16/22 0849 02/16/22 0915  BP: 110/63 111/67  (!) 108/56  Pulse: 66 65 67 66  Resp: '17 17 20 17  '$ Temp: 97.6 F (36.4 C) 97.7 F (36.5 C)  97.6 F (36.4 C)  TempSrc:  Oral  Oral  SpO2: 94% 97%  97%  Weight:      Height:        General: Pt is alert, awake, not in acute distress Cardiovascular: RRR, S1/S2 +, no rubs, no gallops Respiratory: CTA bilaterally, no wheezing, no rhonchi Abdominal: Soft, NT, ND, bowel sounds + Extremities: no edema, no cyanosis    The results of significant diagnostics from this hospitalization (including imaging, microbiology, ancillary and laboratory) are listed below for reference.     Microbiology: No results found for this or any previous visit (from the past 240 hour(s)).   Labs: BNP (last 3 results) Recent Labs    02/09/22 1151  BNP 36.6   Basic Metabolic Panel: Recent Labs  Lab 02/10/22 0325 02/11/22 0100 02/12/22 0209 02/13/22 0937 02/14/22 0151 02/15/22 0127 02/16/22 0132  NA 139   < > 135 134* 136 135 134*  K 3.6   < > 4.0 3.9 4.1 4.5 4.8  CL 102   < > 103 98 101 100 100  CO2 29   < > 24  $'29 29 27 28  'w$ GLUCOSE 198*   < > 195* 227* 238* 258* 324*  BUN 6*   < > '10 8 13 18 20  '$ CREATININE 0.85   < > 0.64 0.69 0.84 1.04* 1.04*  CALCIUM 9.0   < > 9.3 9.6 10.3 10.9* 11.0*  MG 1.9  --   --   --   --   --   --    < > = values in this interval not displayed.   Liver Function Tests: Recent Labs  Lab 02/10/22 0325  AST 20  ALT 14  ALKPHOS 57  BILITOT 0.6  PROT 6.8  ALBUMIN 2.7*   No results for input(s): "LIPASE", "AMYLASE" in the last 168 hours. No results for input(s): "AMMONIA" in the last 168 hours. CBC: Recent Labs  Lab 02/10/22 0325  WBC 9.9  HGB 12.8  HCT 41.6  MCV 81.3  PLT 274   Cardiac Enzymes: No results for input(s): "CKTOTAL", "CKMB", "CKMBINDEX", "TROPONINI" in the last 168 hours. BNP: Invalid input(s): "POCBNP" CBG: Recent  Labs  Lab 02/15/22 0818 02/15/22 1137 02/15/22 1701 02/15/22 2208 02/16/22 0852  GLUCAP 226* 219* 303* 366* 260*   D-Dimer No results for input(s): "DDIMER" in the last 72 hours. Hgb A1c No results for input(s): "HGBA1C" in the last 72 hours. Lipid Profile No results for input(s): "CHOL", "HDL", "LDLCALC", "TRIG", "CHOLHDL", "LDLDIRECT" in the last 72 hours. Thyroid function studies No results for input(s): "TSH", "T4TOTAL", "T3FREE", "THYROIDAB" in the last 72 hours.  Invalid input(s): "FREET3" Anemia work up No results for input(s): "VITAMINB12", "FOLATE", "FERRITIN", "TIBC", "IRON", "RETICCTPCT" in the last 72 hours. Urinalysis No results found for: "COLORURINE", "APPEARANCEUR", "LABSPEC", "PHURINE", "GLUCOSEU", "HGBUR", "BILIRUBINUR", "KETONESUR", "PROTEINUR", "UROBILINOGEN", "NITRITE", "LEUKOCYTESUR" Sepsis Labs Recent Labs  Lab 02/10/22 0325  WBC 9.9   Microbiology No results found for this or any previous visit (from the past 240 hour(s)).   Time coordinating discharge: Over 30 minutes  SIGNED:   Little Ishikawa, DO [facility] [facility]ists 02/16/2022, 11:52 AM Pager   If 7PM-7AM, please contact night-coverage [email or web address]

## 2022-02-28 ENCOUNTER — Inpatient Hospital Stay: Payer: Medicare Other | Admitting: Nurse Practitioner

## 2022-03-01 ENCOUNTER — Ambulatory Visit: Payer: Medicare Other

## 2022-03-08 ENCOUNTER — Ambulatory Visit (INDEPENDENT_AMBULATORY_CARE_PROVIDER_SITE_OTHER): Payer: Medicare Other | Admitting: Podiatry

## 2022-03-08 DIAGNOSIS — M79674 Pain in right toe(s): Secondary | ICD-10-CM | POA: Diagnosis not present

## 2022-03-08 DIAGNOSIS — B351 Tinea unguium: Secondary | ICD-10-CM

## 2022-03-08 DIAGNOSIS — M79675 Pain in left toe(s): Secondary | ICD-10-CM | POA: Diagnosis not present

## 2022-03-08 NOTE — Progress Notes (Signed)
  Subjective:  Patient ID: Joy Becker, female    DOB: 06/28/1950,  MRN: [id number]  Chief Complaint  Patient presents with   Nail Problem    Nail trim    71 y.o. female returns for the above complaint.  Patient presents with a complaint of thickened elongated dystrophic toenails x10.  Patient states that they are painful to walk on.  They have gone along and she is unable to debride down herself.  She would like for me to debride all detail toenails.    Objective:  There were no vitals filed for this visit. Podiatric Exam: Vascular: dorsalis pedis and posterior tibial pulses are palpable bilateral. Capillary return is immediate. Temperature gradient is WNL. Skin turgor WNL  Sensorium: Normal Semmes Weinstein monofilament test. Normal tactile sensation bilaterally. Nail Exam: Pt has thick disfigured discolored nails with subungual debris noted bilateral entire nail hallux through fifth toenails Ulcer Exam: There is no evidence of ulcer or pre-ulcerative changes or infection. Orthopedic Exam: Muscle tone and strength are WNL. No limitations in general ROM. No crepitus or effusions noted. HAV  B/L.  Hammer toes 2-5  B/L.   Skin: No Porokeratosis. No infection or ulcers  Assessment & Plan:  Patient was evaluated and treated and all questions answered.     Onychomycosis with pain  -Nails palliatively debrided as below. -Educated on self-care -Penlac was dispensed as patient would like to try a topical medication to help address the fungus on her toes bilaterally.  I explained to her that this works about 30 to 40% of the time at best.  She states understanding would like to still proceed with the topical medication. -AmLactin lotion was dispensed for dry skin bilaterally.  Procedure: Nail Debridement Rationale: pain  Type of Debridement: manual, sharp debridement. Instrumentation: Nail nipper, rotary burr. Number of Nails: 10  Procedures and Treatment: Consent by patient was  obtained for treatment procedures. The patient understood the discussion of treatment and procedures well. All questions were answered thoroughly reviewed. Debridement of mycotic and hypertrophic toenails, 1 through 5 bilateral and clearing of subungual debris. No ulceration, no infection noted.  Return Visit-Office Procedure: Patient instructed to return to the office for a follow up visit 3 months for continued evaluation and treatment.  Boneta Lucks, DPM    Return in about 3 months (around 06/07/2022).

## 2022-03-09 ENCOUNTER — Encounter: Payer: Self-pay | Admitting: Internal Medicine

## 2022-03-17 ENCOUNTER — Other Ambulatory Visit: Payer: Self-pay | Admitting: Family Medicine

## 2022-03-17 DIAGNOSIS — Z1231 Encounter for screening mammogram for malignant neoplasm of breast: Secondary | ICD-10-CM

## 2022-03-17 IMAGING — US US SOFT TISSUE HEAD/NECK
1 series · 5 of 5 positions shown · non-contrast
Comparison: Brain MRI-10/01/2020

CLINICAL DATA: Indeterminate nodule within the left parotid gland
incidentally noted on brain MRI 10/01/2020

EXAM:
ULTRASOUND OF HEAD/NECK SOFT TISSUES
TECHNIQUE: Ultrasound examination of the head and neck soft tissues was
performed in the area of clinical concern.

[Series 1: us fna biopsy salivary gland parotid gland 1st les · 5 acquisitions, 5 frames shown]
[im 1/5]
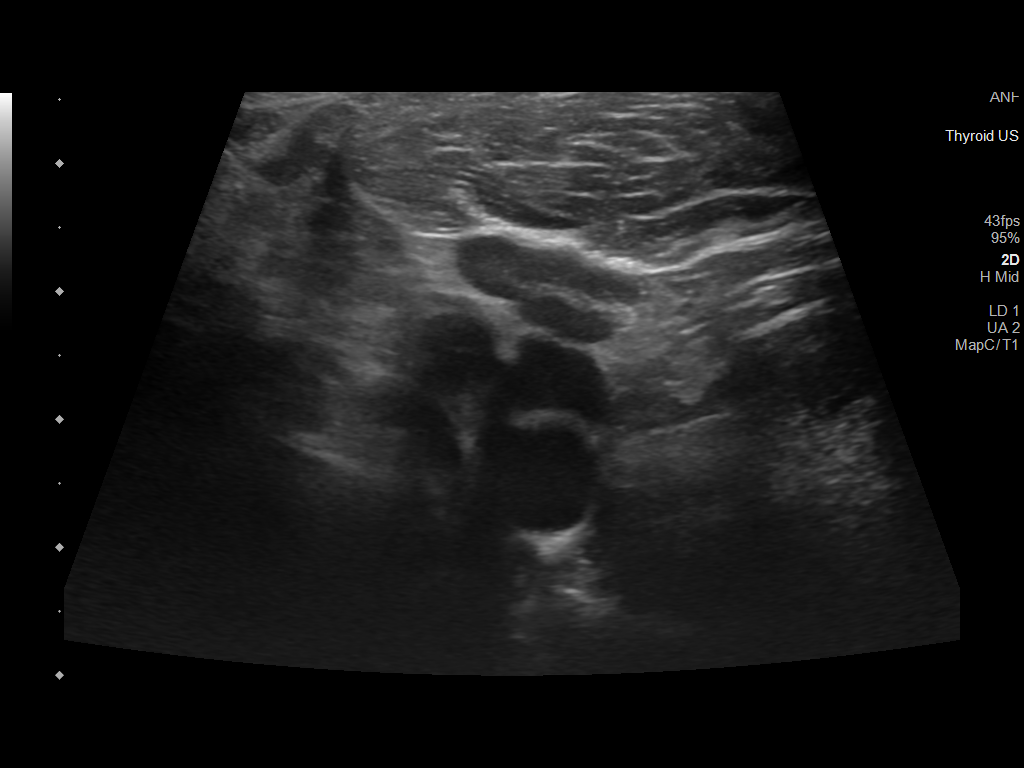
[im 2/5]
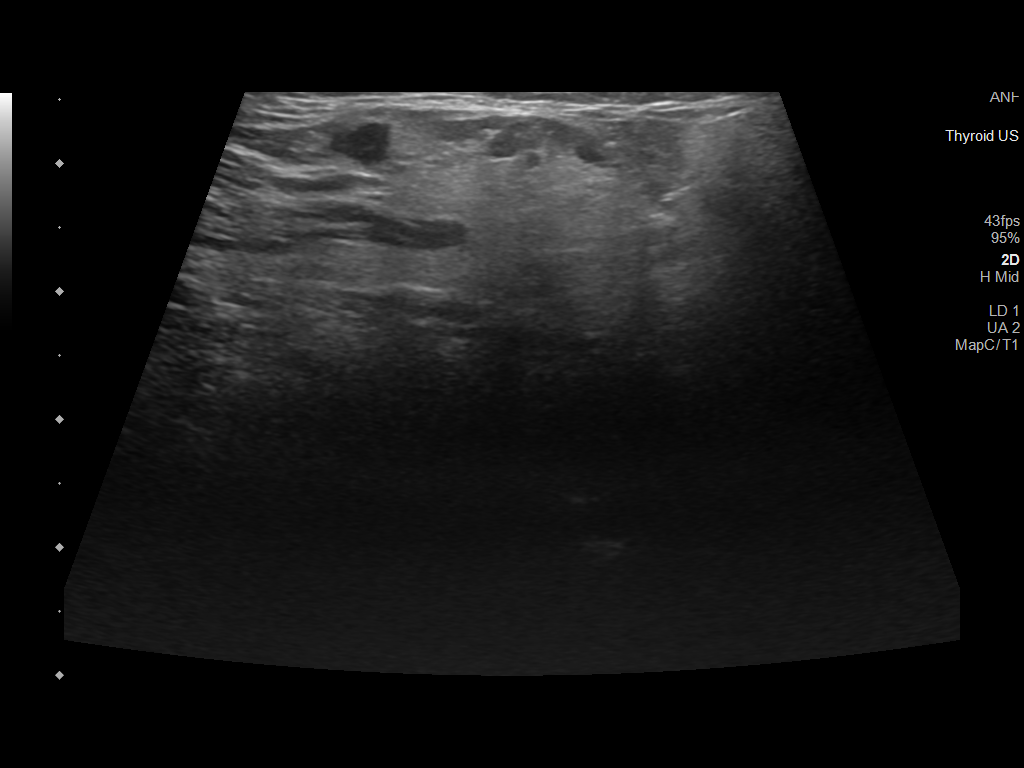
[im 3/5]
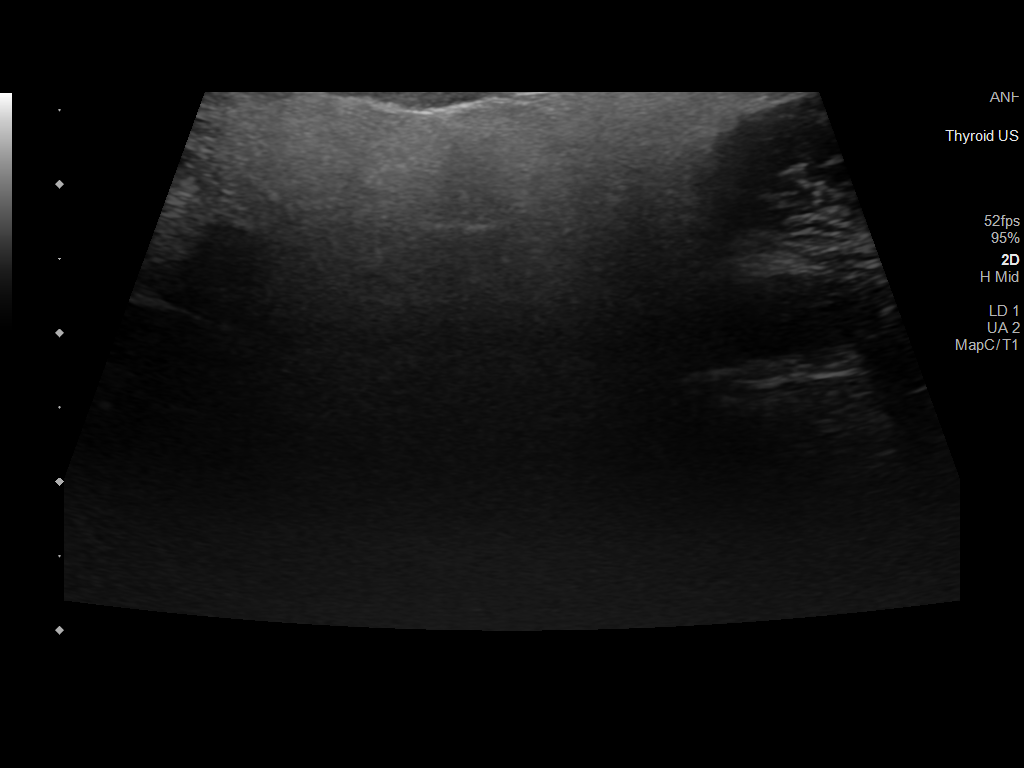
[im 4/5]
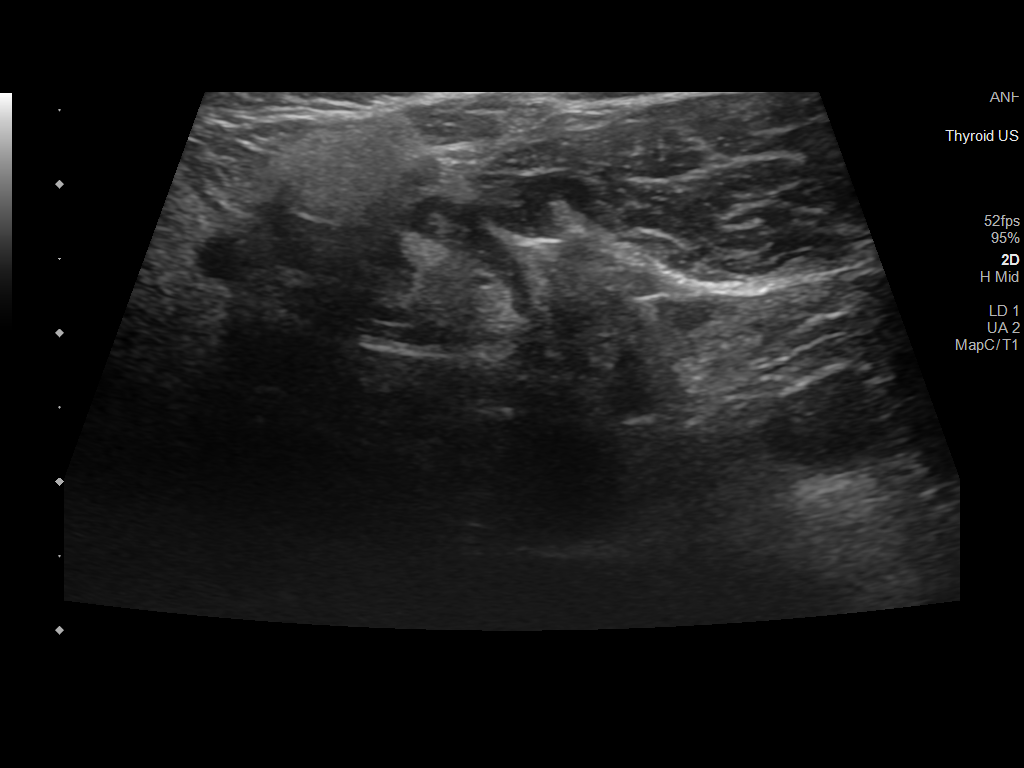
[im 5/5]
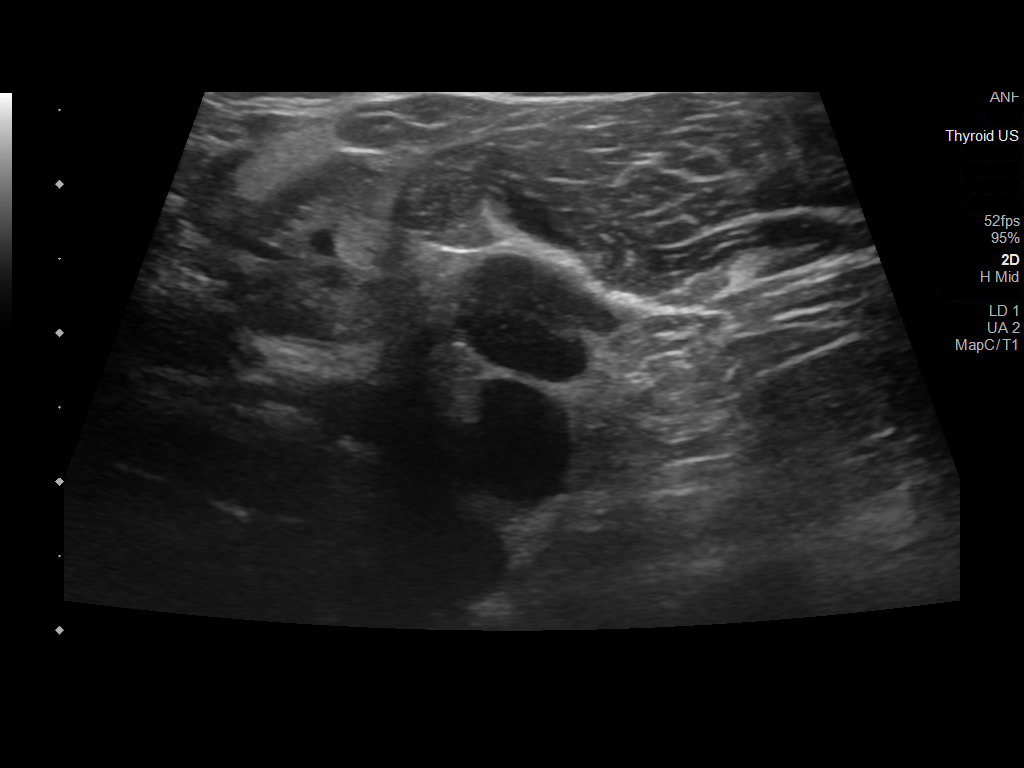

[5 of 5 positions shown; findings below may reference images not displayed]

FINDINGS: Sonographic evaluation performed by the dictating interventional
radiologist fails to delineate an abnormality within the left
parotid gland. Specifically, no intra glandular nodule or lesion. No
intraglandular ductal dilatation.

Note is made of two adjacent benign appearing non pathologically
enlarged left cervical lymph nodes with dominant cervical lymph node
not enlarged by size criteria measuring 0.7 cm in diameter and both
maintaining benign fatty hila (representative images 2 and 5),
presumably reactive in etiology.
IMPRESSION: 1. No sonographic correlate for questioned left parotid gland
nodule. No biopsy performed. If clinical concern persists, further
evaluation with contrast-enhanced neck CT could be as indicated.
2. Adjacent benign-appearing left cervical lymph nodes, presumably
reactive in etiology.

## 2022-04-19 ENCOUNTER — Encounter: Payer: Self-pay | Admitting: Internal Medicine

## 2022-04-19 ENCOUNTER — Ambulatory Visit (INDEPENDENT_AMBULATORY_CARE_PROVIDER_SITE_OTHER): Payer: Medicare Other | Admitting: Internal Medicine

## 2022-04-19 DIAGNOSIS — K625 Hemorrhage of anus and rectum: Secondary | ICD-10-CM | POA: Diagnosis not present

## 2022-04-19 DIAGNOSIS — R109 Unspecified abdominal pain: Secondary | ICD-10-CM | POA: Diagnosis not present

## 2022-04-19 DIAGNOSIS — K219 Gastro-esophageal reflux disease without esophagitis: Secondary | ICD-10-CM | POA: Diagnosis not present

## 2022-04-19 DIAGNOSIS — K6289 Other specified diseases of anus and rectum: Secondary | ICD-10-CM | POA: Diagnosis not present

## 2022-04-19 NOTE — Progress Notes (Addendum)
Chief Complaint: Rectal bleeding  HPI : 71 year old female with history of COPD on home oxygen, HFpEF, DM, hypothyroidism presents with rectal bleeding  She recently had a positive FOBT done by her PCP. She has seen some blood when she wipes with toilet paper but otherwise has not seen any blood in her stools. She has a sore spot on her butt on the outside. She had this examined by a provider but is not sure what was found. When she passes a stool, this is not painful. She was recently put on 2 L [place] oxygen for COPD after a recent hospitalization 8/17-8/24/23 when her oxygen levels were found to be low. Her last colonoscopy was a couple of years ago in [facility] but she is not sure exactly where or what year it was done. Her last colonoscopy had colon polyps that had to be removed. She is having some diarrhea while on Linzess (she is constipated normally). The Linzess seems to be working well. She is on average having one BM every 1-2 days. She has had GERD for most of her life. She takes Pepcid QD, which keeps it relatively well controlled. She still has some breakthrough GERD symptoms. Denies family history of GI cancers. She has some soreness in her abdomen diffusely that started in 02/2022. Has sore throat but no dysphagia. Denies unintentional weight loss.  Wt Readings from Last 3 Encounters:  04/19/22 257 lb 4 oz (116.7 kg)  02/13/22 259 lb 11.2 oz (117.8 kg)  12/22/20 252 lb (114.3 kg)   Past Medical History:  Diagnosis Date   COPD (chronic obstructive pulmonary disease) (HCC)    Diabetes mellitus without complication (HCC)    Gallstones    High cholesterol    Hypertension    Hypothyroidism    Liver disease    Vertigo    Past Surgical History:  Procedure Laterality Date   CHOLECYSTECTOMY     TOTAL VAGINAL HYSTERECTOMY     Family History  Problem Relation Age of Onset   Diabetes Mother    Aneurysm Mother    Cancer Sister    Breast cancer Daughter    Social History   Tobacco Use    Smoking status: Former    Types: Cigarettes   Smokeless tobacco: Former   Tobacco comments:    Has stopped smoking since Mother's Day 2022. Chews Nicorette gum   Vaping Use   Vaping Use: Never used  Substance Use Topics   Alcohol use: Never   Drug use: Never   Current Outpatient Medications  Medication Sig Dispense Refill   acetaminophen (TYLENOL) 500 MG tablet Take 500-1,000 mg by mouth every 8 (eight) hours as needed for moderate pain or headache.     albuterol (VENTOLIN HFA) 108 (90 Base) MCG/ACT inhaler Inhale 1-2 puffs into the lungs every 6 (six) hours as needed for wheezing or shortness of breath.     alendronate (FOSAMAX) 70 MG tablet Take 70 mg by mouth every Sunday. Take with a full glass of water on an empty stomach.     ammonium lactate (AMLACTIN) 12 % lotion Apply 1 application topically as needed for dry skin. (Patient taking differently: Apply 1 application  topically daily as needed for dry skin.) 400 g 0   arformoterol (BROVANA) 15 MCG/2ML NEBU Take 2 mLs (15 mcg total) by nebulization 2 (two) times daily. 120 mL 1   aspirin EC 81 MG tablet Take 81 mg by mouth daily. Swallow whole.     budesonide (  PULMICORT) 0.25 MG/2ML nebulizer solution Take 2 mLs (0.25 mg total) by nebulization 2 (two) times daily. 60 mL 12   empagliflozin (JARDIANCE) 10 MG TABS tablet Take 10 mg by mouth daily.     famotidine (PEPCID) 20 MG tablet Take 20 mg by mouth 2 (two) times daily.     furosemide (LASIX) 20 MG tablet Take 20 mg by mouth daily.     INCRUSE ELLIPTA 62.5 MCG/INH AEPB Inhale 1 puff into the lungs daily.     ipratropium-albuterol (DUONEB) 0.5-2.5 (3) MG/3ML SOLN Take 3 mLs by nebulization every 3 (three) hours as needed. 360 mL 1   JANUVIA 100 MG tablet Take 100 mg by mouth daily.     levothyroxine (SYNTHROID) 100 MCG tablet Take 100 mcg by mouth daily.     linaclotide (LINZESS) 72 MCG capsule Take 72 mcg by mouth daily before breakfast.     losartan (COZAAR) 50 MG tablet Take 50  mg by mouth daily.     methylPREDNISolone (MEDROL DOSEPAK) 4 MG TBPK tablet Taper as directed 1 each 0   metoprolol succinate (TOPROL-XL) 50 MG 24 hr tablet Take 50 mg by mouth daily. Take with or immediately following a meal.     montelukast (SINGULAIR) 10 MG tablet Take 10 mg by mouth at bedtime.     rosuvastatin (CRESTOR) 10 MG tablet Take 10 mg by mouth daily.     Vitamin D, Ergocalciferol, (DRISDOL) 1.25 MG (50000 UNIT) CAPS capsule Take 50,000 Units by mouth every Saturday.     Polyethyl Glycol-Propyl Glycol (SYSTANE) 0.4-0.3 % SOLN Place 1 drop into both eyes daily as needed (dry eyes). (Patient not taking: Reported on 04/19/2022)     potassium chloride SA (KLOR-CON M) 20 MEQ tablet Take 1 tablet (20 mEq total) by mouth daily. (Patient not taking: Reported on 04/19/2022) 30 tablet 1   No current facility-administered medications for this visit.   Allergies  Allergen Reactions   Ace Inhibitors Other (See Comments)    cough   Azithromycin Other (See Comments)    Unaware      Review of Systems: All systems reviewed and negative except where noted in HPI.   Physical Exam: BP 100/60   Pulse 66   Ht '5\' 5"'$  (1.651 m)   Wt 257 lb 4 oz (116.7 kg)   BMI 42.81 kg/m  Constitutional: Pleasant,well-developed, female in no acute distress. HEENT: Normocephalic and atraumatic. Conjunctivae are normal. No scleral icterus. Cardiovascular: Normal rate, regular rhythm.  Pulmonary/chest: Effort normal and breath sounds normal. No wheezing, rales or rhonchi. On [place] oxygen. Abdominal: Soft, nondistended, mildly tender diffusely. Bowel sounds active throughout. There are no masses palpable. No hepatomegaly. Rectal: Deferred Extremities: No edema Neurological: Alert and oriented Skin: Skin is warm and dry. No rashes noted. Psychiatric: Normal mood and affect. Behavior is normal.  Labs 01/2022: BMP with mildly elevated Cr of 1.04 and elevated glucose of 324. CBC with nml Hb. CMP with nml  LFTs.  CTA PE 02/09/22: IMPRESSION: 1. No evidence for pulmonary embolism. 2. Cardiomegaly. 3. Enlargement of the main pulmonary artery which can be seen with pulmonary artery hypertension. 4. Diffuse patchy ground-glass opacities may represent pulmonary edema or infection. 5. Subpleural opacities in the lung bases may be related to chronic scarring. 6. Mild bilateral hilar lymphadenopathy.  TTE 02/10/22: IMPRESSIONS   1. Left ventricular ejection fraction, by estimation, is 60 to 65%. The  left ventricle has normal function. The left ventricle has no regional  wall motion abnormalities. There  is mild left ventricular hypertrophy.  Left ventricular diastolic parameters are consistent with Grade I diastolic dysfunction (impaired relaxation).   2. Right ventricular systolic function is normal. The right ventricular size is normal. Tricuspid regurgitation signal is inadequate for assessing PA pressure.   3. No evidence of mitral valve regurgitation.   4. Aortic valve regurgitation is not visualized.   5. The inferior vena cava is normal in size with greater than 50%  respiratory variability, suggesting right atrial pressure of 3 mmHg.  ASSESSMENT AND PLAN: Positive FOBT Rectal bleeding Rectal pain GERD Mild diffuse abdominal discomfort Patient presents with a positive FOBT along with some recent rectal bleeding and rectal pain. Unclear when her last colonoscopy was but anticipate it was several years ago. Will plan for colonoscopy for further evaluation. Patient also has GERD and some mild diffuse ab discomfort so will add on an EGD to her colonoscopy procedure. - Will attempt to get the patient a follow up with pulmonology as previously recommended  - EGD/colonoscopy [facility] because patient is on [place] oxygen  Christia Reading, MD

## 2022-04-19 NOTE — Patient Instructions (Addendum)
_______________________________________________________  If you are age 70 or older, your body mass index should be between 23-30. Your Body mass index is 42.81 kg/m. If this is out of the aforementioned range listed, please consider follow up with your Primary Care Provider. ________________________________________________________  The [facility] providers would like to encourage you to use [facility] to communicate with providers for non-urgent requests or questions.  Due to long hold times on the telephone, sending your provider a message by [facility] may be a faster and more efficient way to get a response.  Please allow 48 business hours for a response.  Please remember that this is for non-urgent requests.  _______________________________________________________  Joy Becker have been scheduled for an endoscopy and colonoscopy. Please follow the written instructions given to you at your visit today. Please pick up your prep supplies at the pharmacy within the next 1-3 days. If you use inhalers (even only as needed), please bring them with you on the day of your procedure.  Due to recent changes in healthcare laws, you may see the results of your imaging and laboratory studies on MyChart before your provider has had a chance to review them.  We understand that in some cases there may be results that are confusing or concerning to you. Not all laboratory results come back in the same time frame and the provider may be waiting for multiple results in order to interpret others.  Please give [place] 48 hours in order for your provider to thoroughly review all the results before contacting the office for clarification of your results.   Thank you for entrusting me with your care and choosing [facility]  Dr Lorenso Courier

## 2022-04-19 NOTE — Addendum Note (Signed)
Addended by: Sharyn Creamer on: 04/19/2022 01:19 PM   Modules accepted: Level of Service

## 2022-04-24 ENCOUNTER — Ambulatory Visit: Payer: Medicare Other

## 2022-04-25 ENCOUNTER — Telehealth: Payer: Self-pay | Admitting: Internal Medicine

## 2022-04-25 ENCOUNTER — Other Ambulatory Visit: Payer: Self-pay

## 2022-04-25 NOTE — Telephone Encounter (Signed)
Inbound call from patient requesting to have her procedure at the hospital moved to January of 2024 due to her care partner being out of town. Please advise.  Thank you

## 2022-04-25 NOTE — Telephone Encounter (Signed)
Patient moved to 07/17/2022. She is aware of this. New instructions will be mailed to her. New referral will be placed in case of new insurance in January.

## 2022-06-07 ENCOUNTER — Ambulatory Visit: Payer: Medicare Other | Admitting: Podiatry

## 2022-06-21 ENCOUNTER — Ambulatory Visit: Payer: Medicare Other

## 2022-07-10 ENCOUNTER — Encounter (HOSPITAL_COMMUNITY): Payer: Self-pay | Admitting: Internal Medicine

## 2022-07-10 NOTE — Progress Notes (Signed)
Gaspar Skeeters Bowel Prep reminder: went over instructions  For Anesthesia: PCP - Antony Blackbird, MD   Chest x-ray - 02/13/22 EKG -02/10/22 Stress Test -  n/a ECHO - 02/10/22 Cardiac Cath -  n/a Pacemaker/ICD device last checked: n/a  Blood Thinner Instructions:n/a Last Dose: n/a     Anesthesia review: She has a hx of HTN, COPD, chronic respiratory failure, DM, with some admissions to hospital this year. Latest admission in August 2023 due to acute hypoxic respiratory failure multifactorial in the setting of volume overload, heart failure exacerbation, COPD exacerbation. She was d/c on home oxygen and since has not seen pulmonary as an outpatient.

## 2022-07-11 NOTE — Progress Notes (Signed)
Anesthesia Chart Review: SAME DAY WORK-UP  Case: [id number] Date/Time: 07/17/22 0730   Procedures:      ESOPHAGOGASTRODUODENOSCOPY (EGD) WITH PROPOFOL     COLONOSCOPY WITH PROPOFOL   Anesthesia type: Monitor Anesthesia Care   Pre-op diagnosis: rectal pain, rectal bleeding, abd pain, GERD   [facility] ROOM 4 / [facility]   Surgeons: Sharyn Creamer, MD       DISCUSSION: Patient is a 72 year old female scheduled for the above procedure.  History includes former smoker, COPD, home O2 (2L), HTN, DM2, hypothyroidism (on levothyroxine), hypercholesterolemia, liver disease (not specified), morbid obesity. 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  [place] [facility] admission 02/09/22-02/16/22 for COPD exacerbation with acute hypoxic respiratory failure and volume overload/diastolic CHF.  She was treated with diuresis, prednisone and doxycycline.  Outpatient sleep study recommended for nocturnal oxygen desaturations. 02/10/22 echo showed EF of 60-65%, LV normal function, mild left ventricular hypertrophy, Grade 1 diastolic dysfunction, unable to assess PA pressures. She improved clinically but did qualify for home O2 2L. She canceled 02/28/22 post-hospital pulmonology follow-up with Dr. June Leap.   04/19/22 GI visit with Dr. Lorenso Courier for rectal bleeding with + FOBT by PCP. She also had GERD with some mild diffuse abdominal discomfort. Colonoscopy and EGD planned. Procedure was scheduled at [facility] due to her being on home O2. Appears she had to reschedule for 06/2022 due to her care partner being out of town.  She reported chronic DOE. Pulmonary medications include as needed albuterol MDI or Duoneb nebulizer, Brovana 90 nebulizer twice daily, Pulmicort nebulizer twice daily, Incruse Ellipta 1 puff daily, Singulair 10 mg nightly. She is also on Pepcid 20 mg BID, Lasix 20 mg daily, losartan 50 mg daily, Toprol XL 50 mg daily, Crestor 10 mg daily, ASA 81 mg daily.   Last available A1c  8.5% from 02/10/22. She is on Jardiance 10 mg daily and, Januvia 100 mg daily. She is to hold Jardiance for 3 days prior to procedure.   Reviewed available information with anesthesiologist Oleta Mouse, MD. Anesthesia team to evaluate on the day of procedure.    VS: Wt 115.7 kg   BMI 42.43 kg/m  Wt Readings from Last 3 Encounters:  04/19/22 116.7 kg  02/13/22 117.8 kg  12/22/20 114.3 kg   BP Readings from Last 3 Encounters:  04/19/22 100/60  02/16/22 114/60  12/22/20 132/80   Pulse Readings from Last 3 Encounters:  04/19/22 66  02/16/22 63  12/22/20 86     PROVIDERS: Antony Blackbird, MD is PCP    LABS: Labs as of 02/16/22 show elevated serum calcium 11.0; Na 134, K 4.8, glucose 324, BUN 20, Creatinine 1.04. A1c 02/10/22 was 8.5%.    IMAGES: 1V PCXR 02/13/22: FINDINGS: Stable cardiomediastinal silhouette with normal heart size. No pneumothorax. No pleural effusion. No residual pulmonary edema. Mild bibasilar scarring versus atelectasis, stable. IMPRESSION: 1. No residual pulmonary edema. 2. Stable mild bibasilar scarring versus atelectasis.  CTA Chest 02/09/22: IMPRESSION: 1. No evidence for pulmonary embolism. 2. Cardiomegaly. 3. Enlargement of the main pulmonary artery which can be seen with pulmonary artery hypertension. 4. Diffuse patchy ground-glass opacities may represent pulmonary edema or infection. 5. Subpleural opacities in the lung bases may be related to chronic scarring. 6. Mild bilateral hilar lymphadenopathy. - Aortic Atherosclerosis (ICD10-I70.0).  CTA Neck 06/04/18 ([facility]): IMPRESSION:  1. Right vertebral artery is dominant and is normal  2. 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  3. Both carotid bifurcations are normal  4. No intracranial stenosis or occlusion identified.   EKG: 02/09/22:  Normal sinus rhythm Possible Anterior infarct , age undetermined Abnormal ECG When compared with ECG of  01-Oct-2020 21:09, PREVIOUS ECG IS PRESENT No significant change since last tracing Confirmed by Dorie Rank [phone]) on 02/09/2022 12:54:30 PM - Non-specific ST abnormality appears stable when compared to 10/01/20 tracing.   CV: Echo 02/10/22: IMPRESSIONS   1. Left ventricular ejection fraction, by estimation, is 60 to 65%. The  left ventricle has normal function. The left ventricle has no regional  wall motion abnormalities. There is mild left ventricular hypertrophy.  Left ventricular diastolic parameters  are consistent with Grade I diastolic dysfunction (impaired relaxation).   2. Right ventricular systolic function is normal. The right ventricular  size is normal. Tricuspid regurgitation signal is inadequate for assessing  PA pressure.   3. No evidence of mitral valve regurgitation.   4. Aortic valve regurgitation is not visualized.   5. The inferior vena cava is normal in size with greater than 50%  respiratory variability, suggesting right atrial pressure of 3 mmHg.  - Comparison(s): No significant change from prior study.   Nuclear stress test 03/06/15 ([place]): Impression: 1. Negative for inducible ischemia or infarct.  2. Normal left ventricular systolic ejection fraction and wall  motion.    Past Medical History:  Diagnosis Date   COPD (chronic obstructive pulmonary disease) ([place])    Diabetes mellitus without complication (HCC)    Gallstones    High cholesterol    Hypertension    Hypothyroidism    Liver disease    Vertigo     Past Surgical History:  Procedure Laterality Date   CHOLECYSTECTOMY     TOTAL VAGINAL HYSTERECTOMY      MEDICATIONS: No current facility-administered medications for this encounter.    acetaminophen (TYLENOL) 500 MG tablet   albuterol (VENTOLIN HFA) 108 (90 Base) MCG/ACT inhaler   alendronate (FOSAMAX) 70 MG tablet   ammonium lactate (AMLACTIN) 12 % lotion   arformoterol (BROVANA) 15 MCG/2ML NEBU   aspirin EC 81 MG  tablet   budesonide (PULMICORT) 0.25 MG/2ML nebulizer solution   empagliflozin (JARDIANCE) 10 MG TABS tablet   famotidine (PEPCID) 20 MG tablet   furosemide (LASIX) 20 MG tablet   INCRUSE ELLIPTA 62.5 MCG/INH AEPB   ipratropium-albuterol (DUONEB) 0.5-2.5 (3) MG/3ML SOLN   JANUVIA 100 MG tablet   levothyroxine (SYNTHROID) 100 MCG tablet   linaclotide (LINZESS) 72 MCG capsule   losartan (COZAAR) 50 MG tablet   methylPREDNISolone (MEDROL DOSEPAK) 4 MG TBPK tablet   metoprolol succinate (TOPROL-XL) 50 MG 24 hr tablet   montelukast (SINGULAIR) 10 MG tablet   Polyethyl Glycol-Propyl Glycol (SYSTANE) 0.4-0.3 % SOLN   potassium chloride SA (KLOR-CON M) 20 MEQ tablet   rosuvastatin (CRESTOR) 10 MG tablet   Vitamin D, Ergocalciferol, (DRISDOL) 1.25 MG (50000 UNIT) CAPS capsule    Myra Gianotti, PA-C Surgical Short Stay/Anesthesiology [facility] Phone [phone] [facility] Phone [phone] 07/11/2022 5:42 PM

## 2022-07-11 NOTE — Anesthesia Preprocedure Evaluation (Addendum)
Anesthesia Evaluation  Patient identified by MRN, date of birth, ID band Patient awake    Reviewed: Allergy & Precautions, NPO status , Patient's Chart, lab work & pertinent test results  Airway Mallampati: II  TM Distance: >3 FB Neck ROM: Full    Dental  (+) Dental Advisory Given   Pulmonary COPD,  COPD inhaler, Patient abstained from smoking., former smoker   breath sounds clear to auscultation       Cardiovascular hypertension, Pt. on home beta blockers and Pt. on medications  Rhythm:Regular Rate:Normal  02/10/22 echo showed EF of 60-65%, LV normal function, mild left ventricular hypertrophy, Grade 1 diastolic dysfunction, unable to assess PA pressures.    Neuro/Psych negative neurological ROS     GI/Hepatic negative GI ROS, Neg liver ROS,,,  Endo/Other  diabetesHypothyroidism    Renal/GU negative Renal ROS     Musculoskeletal negative musculoskeletal ROS (+)    Abdominal   Peds  Hematology negative hematology ROS (+)   Anesthesia Other Findings   Reproductive/Obstetrics                             Anesthesia Physical Anesthesia Plan  ASA: 3  Anesthesia Plan: MAC   Post-op Pain Management: Minimal or no pain anticipated   Induction:   PONV Risk Score and Plan: 2 and Propofol infusion and Ondansetron  Airway Management Planned: Simple Face Mask and Natural Airway  Additional Equipment:   Intra-op Plan:   Post-operative Plan:   Informed Consent: I have reviewed the patients History and Physical, chart, labs and discussed the procedure including the risks, benefits and alternatives for the proposed anesthesia with the patient or authorized representative who has indicated his/her understanding and acceptance.       Plan Discussed with: Anesthesiologist  Anesthesia Plan Comments: (PAT note written 07/11/2022 by Myra Gianotti, PA-C.  )       Anesthesia Quick  Evaluation

## 2022-07-17 ENCOUNTER — Ambulatory Visit (HOSPITAL_COMMUNITY): Payer: 59 | Admitting: Vascular Surgery

## 2022-07-17 ENCOUNTER — Ambulatory Visit (HOSPITAL_BASED_OUTPATIENT_CLINIC_OR_DEPARTMENT_OTHER): Payer: 59 | Admitting: Vascular Surgery

## 2022-07-17 ENCOUNTER — Other Ambulatory Visit: Payer: Self-pay

## 2022-07-17 ENCOUNTER — Encounter (HOSPITAL_COMMUNITY)
Admission: RE | Disposition: A | Discharge status: Home / Self Care | Payer: Self-pay | Source: Ambulatory Visit | Attending: Internal Medicine

## 2022-07-17 ENCOUNTER — Ambulatory Visit (HOSPITAL_COMMUNITY)
Admission: RE | Admit: 2022-07-17 | Discharge: 2022-07-17 | Disposition: A | Discharge status: Home / Self Care | Payer: 59 | Source: Ambulatory Visit | Attending: Internal Medicine | Admitting: Internal Medicine

## 2022-07-17 DIAGNOSIS — K5730 Diverticulosis of large intestine without perforation or abscess without bleeding: Secondary | ICD-10-CM | POA: Diagnosis not present

## 2022-07-17 DIAGNOSIS — R109 Unspecified abdominal pain: Secondary | ICD-10-CM

## 2022-07-17 DIAGNOSIS — K2970 Gastritis, unspecified, without bleeding: Secondary | ICD-10-CM

## 2022-07-17 DIAGNOSIS — K219 Gastro-esophageal reflux disease without esophagitis: Secondary | ICD-10-CM | POA: Diagnosis not present

## 2022-07-17 DIAGNOSIS — E119 Type 2 diabetes mellitus without complications: Secondary | ICD-10-CM | POA: Insufficient documentation

## 2022-07-17 DIAGNOSIS — K222 Esophageal obstruction: Secondary | ICD-10-CM | POA: Insufficient documentation

## 2022-07-17 DIAGNOSIS — D123 Benign neoplasm of transverse colon: Secondary | ICD-10-CM | POA: Diagnosis not present

## 2022-07-17 DIAGNOSIS — I10 Essential (primary) hypertension: Secondary | ICD-10-CM | POA: Diagnosis not present

## 2022-07-17 DIAGNOSIS — K2950 Unspecified chronic gastritis without bleeding: Secondary | ICD-10-CM | POA: Diagnosis not present

## 2022-07-17 DIAGNOSIS — K317 Polyp of stomach and duodenum: Secondary | ICD-10-CM | POA: Diagnosis not present

## 2022-07-17 DIAGNOSIS — Z7984 Long term (current) use of oral hypoglycemic drugs: Secondary | ICD-10-CM

## 2022-07-17 DIAGNOSIS — D125 Benign neoplasm of sigmoid colon: Secondary | ICD-10-CM | POA: Diagnosis not present

## 2022-07-17 DIAGNOSIS — K6289 Other specified diseases of anus and rectum: Secondary | ICD-10-CM

## 2022-07-17 DIAGNOSIS — K51411 Inflammatory polyps of colon with rectal bleeding: Secondary | ICD-10-CM | POA: Diagnosis not present

## 2022-07-17 DIAGNOSIS — E039 Hypothyroidism, unspecified: Secondary | ICD-10-CM | POA: Insufficient documentation

## 2022-07-17 DIAGNOSIS — K648 Other hemorrhoids: Secondary | ICD-10-CM | POA: Diagnosis not present

## 2022-07-17 DIAGNOSIS — J449 Chronic obstructive pulmonary disease, unspecified: Secondary | ICD-10-CM | POA: Diagnosis not present

## 2022-07-17 DIAGNOSIS — D124 Benign neoplasm of descending colon: Secondary | ICD-10-CM | POA: Diagnosis not present

## 2022-07-17 DIAGNOSIS — Z87891 Personal history of nicotine dependence: Secondary | ICD-10-CM | POA: Diagnosis not present

## 2022-07-17 DIAGNOSIS — D122 Benign neoplasm of ascending colon: Secondary | ICD-10-CM | POA: Diagnosis not present

## 2022-07-17 DIAGNOSIS — K625 Hemorrhage of anus and rectum: Secondary | ICD-10-CM

## 2022-07-17 HISTORY — PX: COLONOSCOPY WITH PROPOFOL: SHX5780

## 2022-07-17 HISTORY — PX: ENDOSCOPIC MUCOSAL RESECTION: SHX6839

## 2022-07-17 HISTORY — PX: SUBMUCOSAL LIFTING INJECTION: SHX6855

## 2022-07-17 HISTORY — PX: BIOPSY: SHX5522

## 2022-07-17 HISTORY — PX: ESOPHAGOGASTRODUODENOSCOPY (EGD) WITH PROPOFOL: SHX5813

## 2022-07-17 HISTORY — PX: POLYPECTOMY: SHX5525

## 2022-07-17 HISTORY — PX: HEMOSTASIS CLIP PLACEMENT: SHX6857

## 2022-07-17 HISTORY — PX: SUBMUCOSAL TATTOO INJECTION: SHX6856

## 2022-07-17 LAB — GLUCOSE, CAPILLARY
Glucose-Capillary: 327 mg/dL — ABNORMAL HIGH (ref 70–99)
Glucose-Capillary: 253 mg/dL — ABNORMAL HIGH (ref 70–99)
Glucose-Capillary: 277 mg/dL — ABNORMAL HIGH (ref 70–99)

## 2022-07-17 SURGERY — ESOPHAGOGASTRODUODENOSCOPY (EGD) WITH PROPOFOL
Anesthesia: Monitor Anesthesia Care

## 2022-07-17 MED ORDER — PHENYLEPHRINE 80 MCG/ML (10ML) SYRINGE FOR IV PUSH (FOR BLOOD PRESSURE SUPPORT)
PREFILLED_SYRINGE | INTRAVENOUS | Status: DC | PRN
  Administered 2022-07-17 (×3): 80 ug via INTRAVENOUS
  Administered 2022-07-17 (×2): 40 ug via INTRAVENOUS
  Administered 2022-07-17 (×3): 80 ug via INTRAVENOUS

## 2022-07-17 MED ORDER — INSULIN ASPART 100 UNIT/ML IJ SOLN
10.0000 [IU] | Freq: Once | INTRAMUSCULAR | Status: AC
  Administered 2022-07-17: 10 [IU] via SUBCUTANEOUS

## 2022-07-17 MED ORDER — PROPOFOL 500 MG/50ML IV EMUL
INTRAVENOUS | Status: DC | PRN
  Administered 2022-07-17: 125 ug/kg/min via INTRAVENOUS

## 2022-07-17 MED ORDER — LACTATED RINGERS IV SOLN
INTRAVENOUS | Status: AC | PRN
  Administered 2022-07-17: 1000 mL via INTRAVENOUS

## 2022-07-17 MED ORDER — INSULIN ASPART 100 UNIT/ML IJ SOLN
INTRAMUSCULAR | Status: AC
  Filled 2022-07-17: qty 1

## 2022-07-17 MED ORDER — PROPOFOL 500 MG/50ML IV EMUL
INTRAVENOUS | Status: AC
  Filled 2022-07-17: qty 50

## 2022-07-17 MED ORDER — SODIUM CHLORIDE 0.9 % IV SOLN: INTRAVENOUS | Status: DC

## 2022-07-17 MED ORDER — PANTOPRAZOLE SODIUM 40 MG PO TBEC: 40.0000 mg | DELAYED_RELEASE_TABLET | Freq: Every day | ORAL | 1 refills | Status: DC

## 2022-07-17 MED ORDER — LIDOCAINE 2% (20 MG/ML) 5 ML SYRINGE
INTRAMUSCULAR | Status: DC | PRN
  Administered 2022-07-17: 40 mg via INTRAVENOUS
  Administered 2022-07-17: 60 mg via INTRAVENOUS

## 2022-07-17 MED ORDER — PROPOFOL 1000 MG/100ML IV EMUL
INTRAVENOUS | Status: AC
  Filled 2022-07-17: qty 200

## 2022-07-17 MED ORDER — SPOT INK MARKER SYRINGE KIT
PACK | SUBMUCOSAL | Status: DC | PRN
  Administered 2022-07-17: 3 mL via SUBMUCOSAL

## 2022-07-17 SURGICAL SUPPLY — 25 items

## 2022-07-17 NOTE — Anesthesia Postprocedure Evaluation (Signed)
Anesthesia Post Note  Patient: Lanett Lasorsa Schueler  Procedure(s) Performed: ESOPHAGOGASTRODUODENOSCOPY (EGD) WITH PROPOFOL COLONOSCOPY WITH PROPOFOL BIOPSY POLYPECTOMY SUBMUCOSAL LIFTING INJECTION HOT HEMOSTASIS (ARGON PLASMA COAGULATION/BICAP) SUBMUCOSAL TATTOO INJECTION HEMOSTASIS CLIP PLACEMENT ENDOSCOPIC MUCOSAL RESECTION     Patient location during evaluation: PACU Anesthesia Type: MAC Level of consciousness: awake and alert Pain management: pain level controlled Vital Signs Assessment: post-procedure vital signs reviewed and stable Respiratory status: spontaneous breathing, nonlabored ventilation, respiratory function stable and patient connected to nasal cannula oxygen Cardiovascular status: stable and blood pressure returned to baseline Postop Assessment: no apparent nausea or vomiting Anesthetic complications: no  No notable events documented.  Last Vitals:  Vitals:   07/17/22 1020 07/17/22 1030  BP: 126/78 (!) 142/86  Pulse: 84 82  Resp: (!) 26 17  Temp:    SpO2: 96% 96%    Last Pain:  Vitals:   07/17/22 1030  TempSrc:   PainSc: 0-No pain                 Tiajuana Amass

## 2022-07-17 NOTE — Op Note (Addendum)
[facility] Patient Name: Joy Becker Procedure Date: 07/17/2022 MRN: [id number] Attending MD: Georgian Co , , [id number] Date of Birth: 01-Aug-1950 CSN: [id number] Age: 72 Admit Type: Outpatient Procedure:                Colonoscopy Indications:              Rectal bleeding, Positive fecal immunochemical                            test, Rectal pain Providers:                Adline Mango" Venia Carbon RN, RN, Luan Moore, Technician Referring MD:             Layla Barter Medicines:                Monitored Anesthesia Care Complications:            No immediate complications. Estimated Blood Loss:     Estimated blood loss was minimal. Procedure:                Pre-Anesthesia Assessment:                           - Prior to the procedure, a History and Physical                            was performed, and patient medications and                            allergies were reviewed. The patient's tolerance of                            previous anesthesia was also reviewed. The risks                            and benefits of the procedure and the sedation                            options and risks were discussed with the patient.                            All questions were answered, and informed consent                            was obtained. Prior Anticoagulants: The patient has                            taken no anticoagulant or antiplatelet agents. ASA                            Grade Assessment: III - A patient with severe  systemic disease. After reviewing the risks and                            benefits, the patient was deemed in satisfactory                            condition to undergo the procedure.                           After obtaining informed consent, the colonoscope                            was passed under direct vision. Throughout the                             procedure, the patient's blood pressure, pulse, and                            oxygen saturations were monitored continuously. The                            CF-HQ190L ([id number]) Olympus colonoscope was                            introduced through the anus and advanced to the the                            cecum, identified by appendiceal orifice and                            ileocecal valve. The colonoscopy was performed                            without difficulty. The patient tolerated the                            procedure well. The quality of the bowel                            preparation was adequate. The terminal ileum,                            ileocecal valve, appendiceal orifice, and rectum                            were photographed. Scope In: 8:22:58 AM Scope Out: 9:43:02 AM Scope Withdrawal Time: 1 hour 13 minutes 29 seconds  Total Procedure Duration: 1 hour 20 minutes 4 seconds  Findings:      Multiple diverticula were found in the entire colon.      Three sessile polyps were found in the sigmoid colon, transverse colon       and ascending colon. The polyps were 3 to 8 mm in size. These polyps       were removed with a cold snare. Resection and retrieval were complete.  A 50 mm polyp was found in the descending colon. The polyp was sessile.       Preparations were made for mucosal resection. EverLift was injected to       raise the lesion. Piecemeal mucosal resection using a hot snare was       performed. Resection and retrieval were complete. Resected tissue       margins were examined and clear of polyp tissue. Snare tip cautery was       performed. To prevent bleeding after the polypectomy, five hemostatic       clips were successfully placed. There was no bleeding at the end of the       procedure. Area distal and on the opposite wall to the polyp was       tattooed with an injection of Spot (carbon black).      Non-bleeding internal hemorrhoids were found  during retroflexion. Impression:               - Diverticulosis in the entire examined colon.                           - Three 3 to 8 mm polyps in the sigmoid colon, in                            the transverse colon and in the ascending colon,                            removed with a cold snare. Resected and retrieved.                           - One 50 mm polyp in the descending colon, removed                            with mucosal resection. Resected and retrieved.                            Clips were placed. Tattooed.                           - Non-bleeding internal hemorrhoids.                           - Mucosal resection was performed. Resection and                            retrieval were complete. Moderate Sedation:      Not Applicable - Patient had care per Anesthesia. Recommendation:           - Discharge patient to home (with escort).                           - Await pathology results.                           - The findings and recommendations were discussed  with the patient. Procedure Code(s):        --- Professional ---                           [phone], Colonoscopy, flexible; with endoscopic                            mucosal resection                           [phone], 58, Colonoscopy, flexible; with removal of                            tumor(s), polyp(s), or other lesion(s) by snare                            technique Diagnosis Code(s):        --- Professional ---                           K64.8, Other hemorrhoids                           D12.5, Benign neoplasm of sigmoid colon                           D12.3, Benign neoplasm of transverse colon (hepatic                            flexure or splenic flexure)                           D12.2, Benign neoplasm of ascending colon                           D12.4, Benign neoplasm of descending colon                           K62.5, Hemorrhage of anus and rectum                            R19.5, Other fecal abnormalities                           K62.89, Other specified diseases of anus and rectum                           K57.30, Diverticulosis of large intestine without                            perforation or abscess without bleeding CPT copyright 2022 American Medical Association. All rights reserved. The codes documented in this report are preliminary and upon coder review may  be revised to meet current compliance requirements. Dr Georgian Co "[name]" [place],  07/17/2022 10:03:49 AM Number of Addenda: 0

## 2022-07-17 NOTE — Op Note (Addendum)
[facility] Patient Name: Joy Becker Procedure Date: 07/17/2022 MRN: [id number] Attending MD: Georgian Co , , [id number] Date of Birth: Sep 28, 1950 CSN: [id number] Age: 72 Admit Type: Outpatient Procedure:                Upper GI endoscopy Indications:              Abdominal pain, Heartburn Providers:                Adline Mango" Venia Carbon RN, RN, Luan Moore, Technician Referring MD:             Layla Barter Medicines:                Monitored Anesthesia Care Complications:            No immediate complications. Estimated Blood Loss:     Estimated blood loss was minimal. Procedure:                Pre-Anesthesia Assessment:                           - Prior to the procedure, a History and Physical                            was performed, and patient medications and                            allergies were reviewed. The patient's tolerance of                            previous anesthesia was also reviewed. The risks                            and benefits of the procedure and the sedation                            options and risks were discussed with the patient.                            All questions were answered, and informed consent                            was obtained. Prior Anticoagulants: The patient has                            taken no anticoagulant or antiplatelet agents. ASA                            Grade Assessment: III - A patient with severe                            systemic disease. After reviewing the risks and  benefits, the patient was deemed in satisfactory                            condition to undergo the procedure.                           After obtaining informed consent, the endoscope was                            passed under direct vision. Throughout the                            procedure, the patient's blood pressure, pulse, and                             oxygen saturations were monitored continuously. The                            [id number]-H190 ([id number]) Olympus endoscope was introduced                            through the mouth, and advanced to the second part                            of duodenum. The upper GI endoscopy was                            accomplished without difficulty. The patient                            tolerated the procedure well. Scope In: Scope Out: Findings:      One moderate (circumferential scarring or stenosis; an endoscope may       pass) stenosis was found at the gastroesophageal junction. This stenosis       measured less than one cm (in length). The stenosis was traversed.       Biopsies were taken with a cold forceps for histology.      Excessive fluid was found in the gastric body.      Localized mild inflammation characterized by congestion (edema) and       erythema was found in the gastric antrum. Biopsies were taken with a       cold forceps for histology.      A single 6 mm umbilicated polyp with no bleeding was found in the       duodenal bulb. Biopsies were taken with a cold forceps for histology. Impression:               - Esophageal stenosis. Biopsied.                           - Excessive gastric fluid                           - Gastritis. Biopsied.                           -  A single duodenal polyp (may be an accessory duct                            as well). Biopsied. Moderate Sedation:      Not Applicable - Patient had care per Anesthesia. Recommendation:           - Await pathology results.                           - Use Protonix (pantoprazole) 40 mg PO daily for 8                            weeks.                           - Perform a colonoscopy today.                           - Return to GI office in 6 weeks. Procedure Code(s):        --- Professional ---                           [phone], Esophagogastroduodenoscopy, flexible,                             transoral; with biopsy, single or multiple Diagnosis Code(s):        --- Professional ---                           K22.2, Esophageal obstruction                           K29.70, Gastritis, unspecified, without bleeding                           K31.7, Polyp of stomach and duodenum                           R10.9, Unspecified abdominal pain                           R12, Heartburn CPT copyright 2022 American Medical Association. All rights reserved. The codes documented in this report are preliminary and upon coder review may  be revised to meet current compliance requirements. Dr Georgian Co "[name]" Lorenso Courier,  07/17/2022 9:56:15 AM Number of Addenda: 0

## 2022-07-17 NOTE — Anesthesia Procedure Notes (Signed)
Procedure Name: MAC Date/Time: 07/17/2022 8:01 AM  Performed by: Eben Burow, CRNAPre-anesthesia Checklist: Patient identified, Emergency Drugs available, Suction available, Patient being monitored and Timeout performed Oxygen Delivery Method: Simple face mask Placement Confirmation: positive ETCO2

## 2022-07-17 NOTE — H&P (Signed)
GASTROENTEROLOGY PROCEDURE H&P NOTE   Primary Care Physician: Layla Barter, MD    Reason for Procedure:   Positive FOBT, rectal bleeding, rectal pain, ab discomfort  Plan:    EGD/colonoscopy  Patient is appropriate for endoscopic procedure(s) in the hospital setting.  The nature of the procedure, as well as the risks, benefits, and alternatives were carefully and thoroughly reviewed with the patient. Ample time for discussion and questions allowed. The patient understood, was satisfied, and agreed to proceed.     HPI: Joy Becker is a 72 y.o. female who presents for EGD/colonoscopy for evaluation of positive FOBT, rectal bleeding, rectal pain, and abdominal discomfort.  Patient was most recently seen in the Gastroenterology Clinic on 04/19/22.  No interval change in medical history since that appointment. Please refer to that note for full details regarding GI history and clinical presentation.   Patient's blood sugars are elevated upon arrival at 327. Review of her prior blood sugars shows that she has had similar blood sugars for months. I did discuss with the patient and her daughter that having elevated blood sugars would increase her risk for heart attack and stroke after the procedure. The patient and her daughter decided to move forward with the procedures knowing these risks. I did encourage her to talk with her PCP about getting her blood sugars under better control in the future.  Past Medical History:  Diagnosis Date   COPD (chronic obstructive pulmonary disease) ([place])    Diabetes mellitus without complication (HCC)    Gallstones    High cholesterol    Hypertension    Hypothyroidism    Liver disease    Vertigo     Past Surgical History:  Procedure Laterality Date   CHOLECYSTECTOMY     TOTAL VAGINAL HYSTERECTOMY      Prior to Admission medications   Medication Sig Start Date End Date Taking? Authorizing Provider  acetaminophen (TYLENOL) 500 MG  tablet Take 500-1,000 mg by mouth every 8 (eight) hours as needed for moderate pain or headache.   Yes [provider]  albuterol (VENTOLIN HFA) 108 (90 Base) MCG/ACT inhaler Inhale 1-2 puffs into the lungs every 6 (six) hours as needed for wheezing or shortness of breath. 10/21/19  Yes [provider]  alendronate (FOSAMAX) 70 MG tablet Take 70 mg by mouth every Sunday. Take with a full glass of water on an empty stomach.   Yes [provider]  aspirin EC 81 MG tablet Take 81 mg by mouth daily. Swallow whole.   Yes [provider]  budesonide (PULMICORT) 0.25 MG/2ML nebulizer solution Take 2 mLs (0.25 mg total) by nebulization 2 (two) times daily. Patient taking differently: Take 0.25 mg by nebulization daily as needed (shortness of breath). 02/16/22  Yes Little Ishikawa, MD  empagliflozin (JARDIANCE) 10 MG TABS tablet Take 10 mg by mouth daily.   Yes [provider]  famotidine (PEPCID) 20 MG tablet Take 20 mg by mouth 2 (two) times daily. 10/16/19  Yes [provider]  furosemide (LASIX) 40 MG tablet Take 40 mg by mouth 2 (two) times daily.   Yes [provider]  INCRUSE ELLIPTA 62.5 MCG/INH AEPB Inhale 1 puff into the lungs every morning. 08/06/19  Yes [provider]  ipratropium-albuterol (DUONEB) 0.5-2.5 (3) MG/3ML SOLN Take 3 mLs by nebulization every 3 (three) hours as needed. 02/16/22  Yes Little Ishikawa, MD  JANUVIA 100 MG tablet Take 100 mg by mouth daily. 08/09/19  Yes  [provider]  levothyroxine (SYNTHROID) 100 MCG tablet Take 100 mcg by mouth daily. 01/06/22  Yes [provider]  linaclotide (LINZESS) 72 MCG capsule Take 72 mcg by mouth daily before breakfast.   Yes [provider]  losartan (COZAAR) 50 MG tablet Take 50 mg by mouth daily.   Yes [provider]  metoprolol succinate (TOPROL-XL) 50 MG 24 hr tablet Take 50 mg by mouth daily. Take with or immediately following  a meal.   Yes [provider]  montelukast (SINGULAIR) 10 MG tablet Take 10 mg by mouth at bedtime.   Yes [provider]  rosuvastatin (CRESTOR) 10 MG tablet Take 10 mg by mouth daily.   Yes [provider]  triamcinolone cream (KENALOG) 0.1 % Apply 1 Application topically daily as needed (Rash). 02/21/22  Yes [provider]  Vitamin D, Ergocalciferol, (DRISDOL) 1.25 MG (50000 UNIT) CAPS capsule Take 50,000 Units by mouth every Saturday.   Yes [provider]  ammonium lactate (AMLACTIN) 12 % lotion Apply 1 application topically as needed for dry skin. Patient not taking: Reported on 07/14/2022 08/22/19   Felipa Furnace, DPM  arformoterol (BROVANA) 15 MCG/2ML NEBU Take 2 mLs (15 mcg total) by nebulization 2 (two) times daily. Patient not taking: Reported on 07/14/2022 02/16/22   Little Ishikawa, MD  potassium chloride SA (KLOR-CON M) 20 MEQ tablet Take 1 tablet (20 mEq total) by mouth daily. Patient not taking: Reported on 04/19/2022 02/17/22   Little Ishikawa, MD    Current Facility-Administered Medications  Medication Dose Route Frequency Provider Last Rate Last Admin   0.9 %  sodium chloride infusion   Intravenous Continuous Sharyn Creamer, MD       lactated ringers infusion    Continuous PRN Sharyn Creamer, MD 10 mL/hr at 07/17/22 0647 1,000 mL at 07/17/22 0647    Allergies as of 04/19/2022 - Review Complete 04/19/2022  Allergen Reaction Noted   Ace inhibitors Other (See Comments) 08/28/2017   Azithromycin Other (See Comments) 08/28/2017    Family History  Problem Relation Age of Onset   Diabetes Mother    Aneurysm Mother    Cancer Sister    Breast cancer Daughter     Social History   Socioeconomic History   Marital status: Widowed    Spouse name: Not on file   Number of children: 3   Years of education: Not on file   Highest education level: Not on file  Occupational History   Not on file  Tobacco Use   Smoking  status: Former    Types: Cigarettes   Smokeless tobacco: Former   Tobacco comments:    Has stopped smoking since Mother's Day 2022. Chews Nicorette gum   Vaping Use   Vaping Use: Never used  Substance and Sexual Activity   Alcohol use: Never   Drug use: Never   Sexual activity: Not on file  Other Topics Concern   Not on file  Social History Narrative   Left Handed    Lives in a facility, patient isn't sure of the name.    Drinks no caffeine   Social Determinants of Radio broadcast assistant Strain: Not on file  Food Insecurity: Not on file  Transportation Needs: Not on file  Physical Activity: Not on file  Stress: Not on file  Social Connections: Not on file  Intimate Partner Violence: Not on file    Physical Exam: Vital signs in last 24 hours: BP Marland Kitchen)  149/78   Pulse 75   Temp (!) 97.5 F (36.4 C) (Tympanic)   Resp 18   Ht '5\' 5"'$  (1.651 m)   Wt 115.7 kg   SpO2 96%   BMI 42.45 kg/m  GEN: NAD EYE: Sclerae anicteric ENT: MMM CV: Non-tachycardic Pulm: No increased WOB GI: Soft NEURO:  Alert & Oriented   Christia Reading, MD [facility]   07/17/2022 7:49 AM

## 2022-07-17 NOTE — Discharge Instructions (Signed)
YOU HAD AN ENDOSCOPIC PROCEDURE TODAY: Refer to the procedure report and other information in the discharge instructions given to you for any specific questions about what was found during the examination. If this information does not answer your questions, please call [place] office at [phone] to clarify.  ° °YOU SHOULD EXPECT: Some feelings of bloating in the abdomen. Passage of more gas than usual. Walking can help get rid of the air that was put into your GI tract during the procedure and reduce the bloating. If you had a lower endoscopy (such as a colonoscopy or flexible sigmoidoscopy) you may notice spotting of blood in your stool or on the toilet paper. Some abdominal soreness may be present for a day or two, also. ° °DIET: Your first meal following the procedure should be a light meal and then it is ok to progress to your normal diet. A half-sandwich or bowl of soup is an example of a good first meal. Heavy or fried foods are harder to digest and may make you feel nauseous or bloated. Drink plenty of fluids but you should avoid alcoholic beverages for 24 hours. If you had a esophageal dilation, please see attached instructions for diet.   ° °ACTIVITY: Your care partner should take you home directly after the procedure. You should plan to take it easy, moving slowly for the rest of the day. You can resume normal activity the day after the procedure however YOU SHOULD NOT DRIVE, use power tools, machinery or perform tasks that involve climbing or major physical exertion for 24 hours (because of the sedation medicines used during the test).  ° °SYMPTOMS TO REPORT IMMEDIATELY: °A gastroenterologist can be reached at any hour. Please call [phone]  for any of the following symptoms:  °Following lower endoscopy (colonoscopy, flexible sigmoidoscopy) °Excessive amounts of blood in the stool  °Significant tenderness, worsening of abdominal pains  °Swelling of the abdomen that is new, acute  °Fever of 100° or  higher  °Following upper endoscopy (EGD, EUS, ERCP, esophageal dilation) °Vomiting of blood or coffee ground material  °New, significant abdominal pain  °New, significant chest pain or pain under the shoulder blades  °Painful or persistently difficult swallowing  °New shortness of breath  °Black, tarry-looking or red, bloody stools ° °FOLLOW UP:  °If any biopsies were taken you will be contacted by phone or by letter within the next 1-3 weeks. Call [phone]  if you have not heard about the biopsies in 3 weeks.  °Please also call with any specific questions about appointments or follow up tests. ° °

## 2022-07-17 NOTE — Transfer of Care (Signed)
Immediate Anesthesia Transfer of Care Note  Patient: Joy Becker  Procedure(s) Performed: ESOPHAGOGASTRODUODENOSCOPY (EGD) WITH PROPOFOL COLONOSCOPY WITH PROPOFOL BIOPSY POLYPECTOMY SUBMUCOSAL LIFTING INJECTION HOT HEMOSTASIS (ARGON PLASMA COAGULATION/BICAP) SUBMUCOSAL TATTOO INJECTION HEMOSTASIS CLIP PLACEMENT ENDOSCOPIC MUCOSAL RESECTION  Patient Location: PACU  Anesthesia Type:MAC  Level of Consciousness: drowsy and responds to stimulation  Airway & Oxygen Therapy: Patient Spontanous Breathing and Patient connected to face mask oxygen  Post-op Assessment: Report given to RN and Post -op Vital signs reviewed and stable  Post vital signs: Reviewed and stable  Last Vitals:  Vitals Value Taken Time  BP 113/59   Temp    Pulse 101   Resp 18   SpO2 100     Last Pain:  Vitals:   07/17/22 0639  TempSrc: Tympanic  PainSc: 0-No pain         Complications: No notable events documented.

## 2022-07-18 ENCOUNTER — Encounter (HOSPITAL_COMMUNITY): Payer: Self-pay | Admitting: Internal Medicine

## 2022-07-19 ENCOUNTER — Encounter: Payer: Self-pay | Admitting: Internal Medicine

## 2022-07-19 LAB — SURGICAL PATHOLOGY

## 2022-08-08 ENCOUNTER — Other Ambulatory Visit: Payer: Self-pay | Admitting: Internal Medicine

## 2022-08-14 ENCOUNTER — Ambulatory Visit: Payer: Medicare Other

## 2022-09-19 ENCOUNTER — Ambulatory Visit: Payer: 59 | Admitting: Podiatrist

## 2022-09-21 ENCOUNTER — Encounter: Payer: Self-pay | Admitting: Internal Medicine

## 2022-09-21 ENCOUNTER — Ambulatory Visit (INDEPENDENT_AMBULATORY_CARE_PROVIDER_SITE_OTHER): Payer: 59 | Admitting: Internal Medicine

## 2022-09-21 VITALS — BP 110/78 | HR 68 | Ht 65.0 in | Wt 253.0 lb

## 2022-09-21 DIAGNOSIS — Z86010 Personal history of colonic polyps: Secondary | ICD-10-CM | POA: Diagnosis not present

## 2022-09-21 DIAGNOSIS — K5900 Constipation, unspecified: Secondary | ICD-10-CM

## 2022-09-21 DIAGNOSIS — K219 Gastro-esophageal reflux disease without esophagitis: Secondary | ICD-10-CM | POA: Diagnosis not present

## 2022-09-21 DIAGNOSIS — Z8719 Personal history of other diseases of the digestive system: Secondary | ICD-10-CM

## 2022-09-21 MED ORDER — PANTOPRAZOLE SODIUM 40 MG PO TBEC: 40.0000 mg | DELAYED_RELEASE_TABLET | Freq: Every day | ORAL | 2 refills | Status: AC

## 2022-09-21 NOTE — Patient Instructions (Addendum)
We have sent the following medications to your pharmacy for you to pick up at your convenience: Pantoprazole  Follow up in 6 months   If your blood pressure at your visit was 140/90 or greater, please contact your primary care physician to follow up on this.  _______________________________________________________  If you are age 71 or older, your body mass index should be between 23-30. Your Body mass index is 42.1 kg/m. If this is out of the aforementioned range listed, please consider follow up with your Primary Care Provider.  If you are age 20 or younger, your body mass index should be between 19-25. Your Body mass index is 42.1 kg/m. If this is out of the aformentioned range listed, please consider follow up with your Primary Care Provider.   ________________________________________________________  The [facility] providers would like to encourage you to use [facility] to communicate with providers for non-urgent requests or questions.  Due to long hold times on the telephone, sending your provider a message by [facility] may be a faster and more efficient way to get a response.  Please allow 48 business hours for a response.  Please remember that this is for non-urgent requests.  _______________________________________________________   Thank you for entrusting me with your care and for choosing [facility], Dr. Christia Reading

## 2022-09-21 NOTE — Progress Notes (Signed)
Chief Complaint: Rectal bleeding  HPI : 72 year old female with history of COPD on home oxygen, HFpEF, DM, hypothyroidism presents for follow up of rectal bleeding and colon polyps  Interval History: She has been somewhat tender in the abdomen and belching, which is fairly normal for her. Denies any overt pain. She was recently started on once weekly Mounjaro. The Mounjaro injections may be causing some of her abdominal discomfort. She is not sure if the Protonix is helping her. Denies chest burning or regurgitation. She takes Linzess, which helps induce a BM. If she doesn't take Linzess, she won't have a BM. She usually has 1-2 BMs per day. Rectal pain and rectal bleeding has improved.  Wt Readings from Last 3 Encounters:  09/21/22 253 lb (114.8 kg)  07/17/22 255 lb 1.2 oz (115.7 kg)  04/19/22 257 lb 4 oz (116.7 kg)   Current Outpatient Medications  Medication Sig Dispense Refill   acetaminophen (TYLENOL) 500 MG tablet Take 500-1,000 mg by mouth every 8 (eight) hours as needed for moderate pain or headache.     albuterol (VENTOLIN HFA) 108 (90 Base) MCG/ACT inhaler Inhale 1-2 puffs into the lungs every 6 (six) hours as needed for wheezing or shortness of breath.     alendronate (FOSAMAX) 70 MG tablet Take 70 mg by mouth every Sunday. Take with a full glass of water on an empty stomach.     ammonium lactate (AMLACTIN) 12 % lotion Apply 1 application topically as needed for dry skin. 400 g 0   arformoterol (BROVANA) 15 MCG/2ML NEBU Take 2 mLs (15 mcg total) by nebulization 2 (two) times daily. 120 mL 1   aspirin EC 81 MG tablet Take 81 mg by mouth daily. Swallow whole.     budesonide (PULMICORT) 0.25 MG/2ML nebulizer solution Take 2 mLs (0.25 mg total) by nebulization 2 (two) times daily. (Patient taking differently: Take 0.25 mg by nebulization daily as needed (shortness of breath).) 60 mL 12   empagliflozin (JARDIANCE) 10 MG TABS tablet Take 10 mg by mouth daily.     furosemide (LASIX) 40  MG tablet Take 40 mg by mouth 2 (two) times daily.     INCRUSE ELLIPTA 62.5 MCG/INH AEPB Inhale 1 puff into the lungs every morning.     ipratropium-albuterol (DUONEB) 0.5-2.5 (3) MG/3ML SOLN Take 3 mLs by nebulization every 3 (three) hours as needed. 360 mL 1   JANUVIA 100 MG tablet Take 100 mg by mouth daily.     levothyroxine (SYNTHROID) 100 MCG tablet Take 100 mcg by mouth daily.     linaclotide (LINZESS) 72 MCG capsule Take 72 mcg by mouth daily before breakfast.     losartan (COZAAR) 50 MG tablet Take 50 mg by mouth daily.     metoprolol succinate (TOPROL-XL) 50 MG 24 hr tablet Take 50 mg by mouth daily. Take with or immediately following a meal.     montelukast (SINGULAIR) 10 MG tablet Take 10 mg by mouth at bedtime.     pantoprazole (PROTONIX) 40 MG tablet TAKE 1 TABLET BY MOUTH EVERY DAY 90 tablet 1   potassium chloride SA (KLOR-CON M) 20 MEQ tablet Take 1 tablet (20 mEq total) by mouth daily. 30 tablet 1   rosuvastatin (CRESTOR) 10 MG tablet Take 10 mg by mouth daily.     triamcinolone cream (KENALOG) 0.1 % Apply 1 Application topically daily as needed (Rash).     Vitamin D, Ergocalciferol, (DRISDOL) 1.25 MG (50000 UNIT) CAPS capsule Take 50,000 Units  by mouth every Saturday.     famotidine (PEPCID) 20 MG tablet Take 20 mg by mouth 2 (two) times daily. (Patient not taking: Reported on 09/21/2022)     No current facility-administered medications for this visit.   Physical Exam: BP 110/78   Pulse 68   Ht 5\' 5"  (1.651 m)   Wt 253 lb (114.8 kg)   BMI 42.10 kg/m  Constitutional: Pleasant,well-developed, female in no acute distress. HEENT: Normocephalic and atraumatic. Conjunctivae are normal. No scleral icterus. Cardiovascular: Normal rate, regular rhythm.  Pulmonary/chest: Effort normal and breath sounds normal. No wheezing, rales or rhonchi. On [place] oxygen. Abdominal: Soft, nondistended, mildly tender diffusely. Bowel sounds active throughout. There are no masses palpable. No  hepatomegaly. Rectal: Deferred Extremities: No edema Neurological: Alert and oriented Skin: Skin is warm and dry. No rashes noted. Psychiatric: Normal mood and affect. Behavior is normal.  Labs 01/2022: BMP with mildly elevated Cr of 1.04 and elevated glucose of 324. CBC with nml Hb. CMP with nml LFTs.  CTA PE 02/09/22: IMPRESSION: 1. No evidence for pulmonary embolism. 2. Cardiomegaly. 3. Enlargement of the main pulmonary artery which can be seen with pulmonary artery hypertension. 4. Diffuse patchy ground-glass opacities may represent pulmonary edema or infection. 5. Subpleural opacities in the lung bases may be related to chronic scarring. 6. Mild bilateral hilar lymphadenopathy.  TTE 02/10/22: IMPRESSIONS   1. Left ventricular ejection fraction, by estimation, is 60 to 65%. The left ventricle has normal function. The left ventricle has no regional wall motion abnormalities. There is mild left ventricular hypertrophy. Left ventricular diastolic parameters are consistent with Grade I diastolic dysfunction (impaired relaxation).   2. Right ventricular systolic function is normal. The right ventricular size is normal. Tricuspid regurgitation signal is inadequate for assessing PA pressure.   3. No evidence of mitral valve regurgitation.   4. Aortic valve regurgitation is not visualized.   5. The inferior vena cava is normal in size with greater than 50% respiratory variability, suggesting right atrial pressure of 3 mmHg.  EGD 07/17/22:  Path: A. DUODENUM, POLYPECTOMY:  Benign duodenal mucosa with prominent Brunner's glands  Negative for dysplasia and carcinoma  B. STOMACH, BIOPSY:  Reactive gastropathy and mild chronic gastritis with lymphoid aggregates  Helicobacter stain negative ([facility], adequate control)  Negative for intestinal metaplasia, dysplasia and carcinoma  C. ESOPHAGEAL STENOSIS, BIOPSY:  Chronic active gastritis with lymphoid aggregates  Scant reactive squamous epithelium   Helicobacter stain negative ([facility], adequate control)  Negative for intestinal metaplasia, dysplasia and carcinoma   Colonoscopy 07/17/22:  Path: D. ASCENDING, TRANSVERSE AND SIGMOID COLON, POLYPECTOMY: Tubular adenoma, 2 fragments Tubulovillous adenoma, 2 fragments Benign inflammatory polyp, 1 fragment Negative for high-grade dysplasia and carcinoma E. COLON, DESCENDING, POLYPECTOMY: Tubulovillous adenoma Negative for high-grade dysplasia and carcinoma Stalk margin free of adenomatous change   ASSESSMENT AND PLAN: GERD History of gastritis History of colon polyps Mild diffuse abdominal discomfort Constipation Patient presents for follow up of gastritis and colon polyps. She does not think that daily PPI therapy has made a significant difference in her pain symptoms. Patient is attempting to actively lose weight so she was started on Mounjaro recently. I do think that the [facility] may increase her risk for reflux in the future so I asked her to stay on daily PPI therapy for now. During her last colonoscopy she was found to have several large colon polyps that were resected. She will be due for repeat colonoscopy in the hospital setting 6 months after her last  colonoscopy due to a piecemeal resection. - Continue Linzess 72 mcg QD - Cont pantoprazole 40 mg QD. Refill.  - Recall for repeat colonoscopy in 12/2022 in the hospital - If abdominal discomfort worsens in the future, could consider further evaluation with CT A/P w/contrast - RTC in 6 months  Christia Reading, MD  I spent 35 minutes of time, including in depth chart review, independent review of results as outlined above, communicating results with the patient directly, face-to-face time with the patient, coordinating care, and ordering studies and medications as appropriate, and documentation.

## 2022-09-28 ENCOUNTER — Ambulatory Visit
Admission: RE | Admit: 2022-09-28 | Discharge: 2022-09-28 | Disposition: A | Discharge status: Home / Self Care | Payer: 59 | Source: Ambulatory Visit | Attending: Family Medicine | Admitting: Family Medicine

## 2022-09-28 DIAGNOSIS — Z1231 Encounter for screening mammogram for malignant neoplasm of breast: Secondary | ICD-10-CM

## 2022-10-09 ENCOUNTER — Encounter: Payer: Self-pay | Admitting: Podiatry

## 2022-10-09 ENCOUNTER — Ambulatory Visit (INDEPENDENT_AMBULATORY_CARE_PROVIDER_SITE_OTHER): Payer: 59 | Admitting: Podiatry

## 2022-10-09 DIAGNOSIS — E119 Type 2 diabetes mellitus without complications: Secondary | ICD-10-CM

## 2022-10-09 DIAGNOSIS — B351 Tinea unguium: Secondary | ICD-10-CM | POA: Diagnosis not present

## 2022-10-09 DIAGNOSIS — M79675 Pain in left toe(s): Secondary | ICD-10-CM

## 2022-10-09 DIAGNOSIS — M79674 Pain in right toe(s): Secondary | ICD-10-CM | POA: Diagnosis not present

## 2022-10-09 DIAGNOSIS — E1140 Type 2 diabetes mellitus with diabetic neuropathy, unspecified: Secondary | ICD-10-CM

## 2022-10-09 NOTE — Progress Notes (Signed)
  Subjective:  Patient ID: Joy Becker, female    DOB: 10-01-1950,   MRN: [id number]  Chief Complaint  Patient presents with   Diabetes    Nail trim     72 y.o. female presents for concern of thickened elongated and painful nails that are difficult to trim. Requesting to have them trimmed today. Relates burning and tingling in their feet. Patient is diabetic and last A1c was  Lab Results  Component Value Date   HGBA1C 8.5 (H) 02/10/2022   .   PCP:  Lourena Simmonds, Donald Pore, MD    . Denies any other pedal complaints. Denies n/v/f/c.   Past Medical History:  Diagnosis Date   COPD (chronic obstructive pulmonary disease)    Diabetes mellitus without complication    Gallstones    High cholesterol    Hypertension    Hypothyroidism    Liver disease    Vertigo     Objective:  Physical Exam: Vascular: DP/PT pulses 2/4 bilateral. CFT <3 seconds. Absent hair growth on digits. Edema noted to bilateral lower extremities. Xerosis noted bilaterally.  Skin. No lacerations or abrasions bilateral feet. Nails 1-5 bilateral  are thickened discolored and elongated with subungual debris.  Musculoskeletal: MMT 5/5 bilateral lower extremities in DF, PF, Inversion and Eversion. Deceased ROM in DF of ankle joint.  Neurological: Sensation intact to light touch. Protective sensation diminished bilateral.    Assessment:   1. Pain due to onychomycosis of toenails of both feet   2. Diabetes mellitus without complication      Plan:  Patient was evaluated and treated and all questions answered. -Discussed and educated patient on diabetic foot care, especially with  regards to the vascular, neurological and musculoskeletal systems.  -Stressed the importance of good glycemic control and the detriment of not  controlling glucose levels in relation to the foot. -Discussed supportive shoes at all times and checking feet regularly.  -Mechanically debrided all nails 1-5 bilateral using sterile nail  nipper and filed with dremel with small icnident to left great toe covered with neosporin and bandaid.  -Fitted for DM shoes today.  -Answered all patient questions -Patient to return  in 3 months for at risk foot care -Patient advised to call the office if any problems or questions arise in the meantime.   Louann Sjogren, DPM

## 2023-01-10 ENCOUNTER — Ambulatory Visit: Payer: 59 | Admitting: Podiatry

## 2023-10-31 ENCOUNTER — Other Ambulatory Visit: Payer: Self-pay | Admitting: Family Medicine

## 2023-10-31 DIAGNOSIS — Z0000 Encounter for general adult medical examination without abnormal findings: Secondary | ICD-10-CM

## 2023-11-20 ENCOUNTER — Ambulatory Visit
Admission: RE | Admit: 2023-11-20 | Discharge: 2023-11-20 | Disposition: A | Discharge status: Home / Self Care | Source: Ambulatory Visit | Attending: Family Medicine

## 2023-11-20 DIAGNOSIS — Z0000 Encounter for general adult medical examination without abnormal findings: Secondary | ICD-10-CM

## 2023-12-11 ENCOUNTER — Other Ambulatory Visit: Payer: Self-pay | Admitting: Family Medicine

## 2023-12-11 DIAGNOSIS — Z1231 Encounter for screening mammogram for malignant neoplasm of breast: Secondary | ICD-10-CM

## 2023-12-26 ENCOUNTER — Ambulatory Visit (INDEPENDENT_AMBULATORY_CARE_PROVIDER_SITE_OTHER): Admitting: Podiatry

## 2023-12-26 DIAGNOSIS — M79674 Pain in right toe(s): Secondary | ICD-10-CM | POA: Diagnosis not present

## 2023-12-26 DIAGNOSIS — E1140 Type 2 diabetes mellitus with diabetic neuropathy, unspecified: Secondary | ICD-10-CM

## 2023-12-26 DIAGNOSIS — B351 Tinea unguium: Secondary | ICD-10-CM

## 2023-12-26 DIAGNOSIS — M79675 Pain in left toe(s): Secondary | ICD-10-CM

## 2023-12-26 NOTE — Progress Notes (Signed)
  Subjective:  Patient ID: Joy Becker, female    DOB: 1950/10/31,   MRN: [id number]  Chief Complaint  Patient presents with   [facility]    RM#[id number] [facility] right foot callus.    73 y.o. female presents for concern of thickened elongated and painful nails that are difficult to trim. Requesting to have them trimmed today. Relates burning and tingling in their feet. Patient is diabetic and last A1c was  Lab Results  Component Value Date   HGBA1C 8.5 (H) 02/10/2022   .   PCP:  Sigrid Kays, [place], MD    . Denies any other pedal complaints. Denies n/v/f/c.   Past Medical History:  Diagnosis Date   COPD (chronic obstructive pulmonary disease) (HCC)    Diabetes mellitus without complication (HCC)    Gallstones    High cholesterol    Hypertension    Hypothyroidism    Liver disease    Vertigo     Objective:  Physical Exam: Vascular: DP/PT pulses 2/4 bilateral. CFT <3 seconds. Absent hair growth on digits. Edema noted to bilateral lower extremities. Xerosis noted bilaterally.  Skin. No lacerations or abrasions bilateral feet. Nails 1-5 bilateral  are thickened discolored and elongated with subungual debris.  Musculoskeletal: MMT 5/5 bilateral lower extremities in DF, PF, Inversion and Eversion. Deceased ROM in DF of ankle joint.  Neurological: Sensation intact to light touch. Protective sensation diminished bilateral.    Assessment:   1. Pain due to onychomycosis of toenails of both feet   2. Type 2 diabetes mellitus with diabetic neuropathy, unspecified whether long term insulin  use (HCC)      Plan:  Patient was evaluated and treated and all questions answered. -Discussed and educated patient on diabetic foot care, especially with  regards to the vascular, neurological and musculoskeletal systems.  -Stressed the importance of good glycemic control and the detriment of not  controlling glucose levels in relation to the foot. -Discussed supportive shoes at all times and checking feet  regularly.  -Mechanically debrided all nails 1-5 bilateral using sterile nail nipper and filed with dremel without incident .  -Awaiting DM shoes   -Answered all patient questions -Patient to return  in 3 months for at risk foot care -Patient advised to call the office if any problems or questions arise in the meantime.   Joy Becker, DPM

## 2024-01-02 ENCOUNTER — Ambulatory Visit

## 2024-01-16 ENCOUNTER — Ambulatory Visit
Admission: RE | Admit: 2024-01-16 | Discharge: 2024-01-16 | Disposition: A | Discharge status: Home / Self Care | Source: Ambulatory Visit | Attending: Family Medicine

## 2024-01-16 DIAGNOSIS — Z1231 Encounter for screening mammogram for malignant neoplasm of breast: Secondary | ICD-10-CM
